# Patient Record
Sex: Female | Born: 1975 | Race: Black or African American | Hispanic: No | Marital: Single | Smoking: Never smoker
Health system: Southern US, Community
[De-identification: ages and names within clinical notes are randomized; demographics above are authoritative.]

## PROBLEM LIST (undated history)

## (undated) DIAGNOSIS — Z8249 Family history of ischemic heart disease and other diseases of the circulatory system: Secondary | ICD-10-CM

## (undated) DIAGNOSIS — E079 Disorder of thyroid, unspecified: Secondary | ICD-10-CM

## (undated) DIAGNOSIS — G43909 Migraine, unspecified, not intractable, without status migrainosus: Secondary | ICD-10-CM

## (undated) DIAGNOSIS — Z8619 Personal history of other infectious and parasitic diseases: Secondary | ICD-10-CM

## (undated) DIAGNOSIS — T148XXA Other injury of unspecified body region, initial encounter: Secondary | ICD-10-CM

## (undated) HISTORY — DX: Personal history of other infectious and parasitic diseases: Z86.19

## (undated) HISTORY — DX: Other injury of unspecified body region, initial encounter: T14.8XXA

## (undated) HISTORY — DX: Family history of ischemic heart disease and other diseases of the circulatory system: Z82.49

## (undated) HISTORY — PX: WISDOM TOOTH EXTRACTION: SHX21

## (undated) HISTORY — DX: Migraine, unspecified, not intractable, without status migrainosus: G43.909

## (undated) HISTORY — PX: OTHER SURGICAL HISTORY: SHX169

---

## 1999-05-17 ENCOUNTER — Encounter: Admission: RE | Admit: 1999-05-17 | Discharge: 1999-05-17 | Payer: Self-pay | Admitting: Obstetrics

## 2008-09-16 ENCOUNTER — Emergency Department (HOSPITAL_COMMUNITY): Admission: EM | Admit: 2008-09-16 | Discharge: 2008-09-16 | Payer: Self-pay | Admitting: Emergency Medicine

## 2008-11-17 ENCOUNTER — Encounter: Admission: RE | Admit: 2008-11-17 | Discharge: 2008-11-17 | Payer: Self-pay | Admitting: Orthopaedic Surgery

## 2008-11-23 ENCOUNTER — Encounter: Admission: RE | Admit: 2008-11-23 | Discharge: 2009-01-19 | Payer: Self-pay | Admitting: Orthopaedic Surgery

## 2009-01-06 ENCOUNTER — Encounter: Admission: RE | Admit: 2009-01-06 | Discharge: 2009-01-06 | Payer: Self-pay | Admitting: Orthopaedic Surgery

## 2010-10-25 DIAGNOSIS — G43909 Migraine, unspecified, not intractable, without status migrainosus: Secondary | ICD-10-CM | POA: Insufficient documentation

## 2010-12-18 DIAGNOSIS — G4700 Insomnia, unspecified: Secondary | ICD-10-CM | POA: Insufficient documentation

## 2012-03-12 ENCOUNTER — Encounter: Payer: Self-pay | Admitting: Obstetrics and Gynecology

## 2012-03-12 ENCOUNTER — Ambulatory Visit (INDEPENDENT_AMBULATORY_CARE_PROVIDER_SITE_OTHER): Payer: BC Managed Care – PPO | Admitting: Obstetrics and Gynecology

## 2012-03-12 VITALS — BP 120/70 | Ht 64.5 in | Wt 164.0 lb

## 2012-03-12 DIAGNOSIS — Z833 Family history of diabetes mellitus: Secondary | ICD-10-CM

## 2012-03-12 DIAGNOSIS — Z83438 Family history of other disorder of lipoprotein metabolism and other lipidemia: Secondary | ICD-10-CM

## 2012-03-12 DIAGNOSIS — Z2089 Contact with and (suspected) exposure to other communicable diseases: Secondary | ICD-10-CM

## 2012-03-12 DIAGNOSIS — Z8349 Family history of other endocrine, nutritional and metabolic diseases: Secondary | ICD-10-CM

## 2012-03-12 DIAGNOSIS — Z202 Contact with and (suspected) exposure to infections with a predominantly sexual mode of transmission: Secondary | ICD-10-CM

## 2012-03-12 DIAGNOSIS — Z01419 Encounter for gynecological examination (general) (routine) without abnormal findings: Secondary | ICD-10-CM

## 2012-03-12 LAB — COMPREHENSIVE METABOLIC PANEL
ALT: 14 U/L (ref 0–35)
Albumin: 4.4 g/dL (ref 3.5–5.2)
CO2: 29 mEq/L (ref 19–32)
Calcium: 9.7 mg/dL (ref 8.4–10.5)
Chloride: 104 mEq/L (ref 96–112)
Potassium: 4.6 mEq/L (ref 3.5–5.3)
Sodium: 139 mEq/L (ref 135–145)
Total Protein: 7.5 g/dL (ref 6.0–8.3)

## 2012-03-12 LAB — LIPID PANEL
Cholesterol: 241 mg/dL — ABNORMAL HIGH (ref 0–200)
VLDL: 10 mg/dL (ref 0–40)

## 2012-03-12 MED ORDER — HYDROCORTISONE ACE-PRAMOXINE 1-1 % RE FOAM: RECTAL | Status: DC

## 2012-03-12 MED ORDER — NORETHINDRONE-ETH ESTRADIOL 1-35 MG-MCG PO TABS: 1.0000 | ORAL_TABLET | Freq: Every day | ORAL | Status: DC

## 2012-03-12 NOTE — Addendum Note (Signed)
Addended by: Lerry Liner D on: 03/12/2012 02:31 PM   Modules accepted: Orders

## 2012-03-12 NOTE — Patient Instructions (Signed)
OTC Colace 100mg  one or two daily to keep BMs soft.    Rectal Bleeding Rectal bleeding is when blood passes out of the anus. It is usually a sign that something is wrong. It may not be serious, but it should always be evaluated. Rectal bleeding may present as bright red blood or extremely dark stools. The color may range from dark red or maroon to black (like tar). It is important that the cause of rectal bleeding be identified so treatment can be started and the problem corrected. CAUSES   Hemorrhoids. These are enlarged (dilated) blood vessels or veins in the anal or rectal area.  Fistulas. Theseare abnormal, burrowing channels that usually run from inside the rectum to the skin around the anus. They can bleed.  Anal fissures. This is a tear in the tissue of the anus. Bleeding occurs with bowel movements.  Diverticulosis. This is a condition in which pockets or sacs project from the bowel wall. Occasionally, the sacs can bleed.  Diverticulitis. Thisis an infection involving diverticulosis of the colon.  Proctitis and colitis. These are conditions in which the rectum, colon, or both, can become inflamed and pitted (ulcerated).  Polyps and cancer. Polyps are non-cancerous (benign) growths in the colon that may bleed. Certain types of polyps turn into cancer.  Protrusion of the rectum. Part of the rectum can project from the anus and bleed.  Certain medicines.  Intestinal infections.  Blood vessel abnormalities. HOME CARE INSTRUCTIONS  Eat a high-fiber diet to keep your stool soft.  Limit activity.  Drink enough fluids to keep your urine clear or pale yellow.  Warm baths may be useful to soothe rectal pain.  Follow up with your caregiver as directed. SEEK IMMEDIATE MEDICAL CARE IF:  You develop increased bleeding.  You have black or dark red stools.  You vomit blood or material that looks like coffee grounds.  You have abdominal pain or tenderness.  You have a  fever.  You feel weak, nauseous, or you faint.  You have severe rectal pain or you are unable to have a bowel movement. MAKE SURE YOU:  Understand these instructions.  Will watch your condition.  Will get help right away if you are not doing well or get worse. Document Released: 09/28/2001 Document Revised: 07/01/2011 Document Reviewed: 09/23/2010 [facility] Patient Information 2013 [place], [place]. Constipation, Adult Constipation is when a person has fewer than 3 bowel movements a week; has difficulty having a bowel movement; or has stools that are dry, hard, or larger than normal. As people grow older, constipation is more common. If you try to fix constipation with medicines that make you have a bowel movement (laxatives), the problem may get worse. Long-term laxative use may cause the muscles of the colon to become weak. A low-fiber diet, not taking in enough fluids, and taking certain medicines may make constipation worse. CAUSES   Certain medicines, such as antidepressants, pain medicine, iron supplements, antacids, and water pills.   Certain diseases, such as diabetes, irritable bowel syndrome (IBS), thyroid disease, or depression.   Not drinking enough water.   Not eating enough fiber-rich foods.   Stress or travel.  Lack of physical activity or exercise.  Not going to the restroom when there is the urge to have a bowel movement.  Ignoring the urge to have a bowel movement.  Using laxatives too much. SYMPTOMS   Having fewer than 3 bowel movements a week.   Straining to have a bowel movement.  Having hard, dry, or larger than normal stools.   Feeling full or bloated.   Pain in the lower abdomen.  Not feeling relief after having a bowel movement. DIAGNOSIS  Your caregiver will take a medical history and perform a physical exam. Further testing may be done for severe constipation. Some tests may include:   A barium enema X-ray to examine your rectum,  colon, and sometimes, your small intestine.  A sigmoidoscopy to examine your lower colon.  A colonoscopy to examine your entire colon. TREATMENT  Treatment will depend on the severity of your constipation and what is causing it. Some dietary treatments include drinking more fluids and eating more fiber-rich foods. Lifestyle treatments may include regular exercise. If these diet and lifestyle recommendations do not help, your caregiver may recommend taking over-the-counter laxative medicines to help you have bowel movements. Prescription medicines may be prescribed if over-the-counter medicines do not work.  HOME CARE INSTRUCTIONS   Increase dietary fiber in your diet, such as fruits, vegetables, whole grains, and beans. Limit high-fat and processed sugars in your diet, such as [place] fries, hamburgers, cookies, candies, and soda.   A fiber supplement may be added to your diet if you cannot get enough fiber from foods.   Drink enough fluids to keep your urine clear or pale yellow.   Exercise regularly or as directed by your caregiver.   Go to the restroom when you have the urge to go. Do not hold it.  Only take medicines as directed by your caregiver. Do not take other medicines for constipation without talking to your caregiver first. SEEK IMMEDIATE MEDICAL CARE IF:   You have bright red blood in your stool.   Your constipation lasts for more than 4 days or gets worse.   You have abdominal or rectal pain.   You have thin, pencil-like stools.  You have unexplained weight loss. MAKE SURE YOU:   Understand these instructions.  Will watch your condition.  Will get help right away if you are not doing well or get worse. Document Released: 01/05/2004 Document Revised: 07/01/2011 Document Reviewed: 03/12/2011 [facility] Patient Information 2013 [place], [place].

## 2012-03-12 NOTE — Progress Notes (Signed)
Subjective:    Brandy Cole is a 36 y.o. female, G1P0010, who presents for an annual exam.   Pt concerned about lighter period lasting 2-3days x 59yrs. Pt using condoms for [facility]. May consider BCPs.    Last Pap: 01/08/2011 WNL: Yes HPV-not done HPV-not detected in 2011 Regular Periods:yes Contraception: condoms  Monthly Breast exam:no Tetanus<86yrs:no Nl.Bladder Function:yes Daily BMs:no c/o rectal bleeding x 2wks. Occ constipation. No family h/o colon cancer Healthy Diet:yes Calcium:no Mammogram:no Date of Mammogram:  Exercise:no Have often Exercise:  Seatbelt: yes Abuse at home: no Stressful work:no Sigmoid-colonoscopy: n/a Bone Density: No PCP: [facility]. Dr.Dewey Change in PMH: no change  Change in FMH:no change BMI=27     History   Social History  . Marital Status: Married    Spouse Name: N/A    Number of Children: N/A  . Years of Education: N/A   Social History Main Topics  . Smoking status: Never Smoker   . Smokeless tobacco: Never Used  . Alcohol Use: None  . Drug Use: None  . Sexually Active: Yes    Birth Control/ Protection: Condom   Other Topics Concern  . None   Social History Narrative  . None    Menstrual cycle:   LMP: No LMP recorded.           Cycle: regular q28 days but very light less than a panty liner.  No cramps.  No IM bleeding  The following portions of the patient's history were reviewed and updated as appropriate: allergies, current medications, past family history, past medical history, past social history, past surgical history and problem list.  Review of Systems Pertinent items are noted in HPI. Breast:Negative for breast lump,nipple discharge or nipple retraction Gastrointestinal: Negative for abdominal pain.  Has had constipation and had initiation of rectal bleeding at that time.  Felt as if something was tearing.  Now occasionally notices blood on tissue paper. Urinary:negative   Objective:    There were no vitals  taken for this visit.    Weight:  Wt Readings from Last 1 Encounters:  No data found for Wt          BMI: There is no height or weight on file to calculate BMI.  General Appearance: Alert, appropriate appearance for age. No acute distress HEENT: Grossly normal Neck / Thyroid: Supple, no masses, nodes or enlargement Lungs: clear to auscultation bilaterally Back: No CVA tenderness Breast Exam: No masses or nodes.No dimpling, nipple retraction or discharge. Cardiovascular: Regular rate and rhythm. S1, S2, no murmur Gastrointestinal: Soft, non-tender, no masses or organomegaly Pelvic Exam: External genitalia: normal general appearance Vaginal: normal rugae Cervix: normal appearance Adnexa: no masses Uterus: normal single, nontender Rectovaginal: declined by the patient Lymphatic Exam: Non-palpable nodes in neck, clavicular, axillary, or inguinal regions Skin: no rash or abnormalities Neurologic: Normal gait and speech, no tremor  Psychiatric: Alert and oriented, appropriate affect.   Wet Prep:not applicable Urinalysis:not applicable UPT: Not done   Assessment:    Probable rectal fissure  Family hx diabetes and hypercholesterolemia   Plan:  Fasting cmp and lipid panel  pap smear due 2015 return annually or prn Proctofoam HC for 2 wks.  If any rectal bleeding recurs thereafter pt to call for GI referral STD screening: GC/CHL with consent Contraception:oral contraceptives (estrogen/progesterone) pt considering.  Script given   Dierdre Forth MD

## 2012-03-12 NOTE — Addendum Note (Signed)
Addended by: Lerry Liner D on: 03/12/2012 02:36 PM   Modules accepted: Orders

## 2012-03-13 LAB — GC/CHLAMYDIA PROBE AMP: GC Probe RNA: NEGATIVE

## 2012-04-23 DIAGNOSIS — Z8249 Family history of ischemic heart disease and other diseases of the circulatory system: Secondary | ICD-10-CM | POA: Insufficient documentation

## 2013-02-19 ENCOUNTER — Ambulatory Visit (INDEPENDENT_AMBULATORY_CARE_PROVIDER_SITE_OTHER): Payer: BC Managed Care – PPO | Admitting: Podiatrist

## 2013-02-19 ENCOUNTER — Encounter: Payer: Self-pay | Admitting: Podiatrist

## 2013-02-19 VITALS — BP 113/69 | HR 77 | Resp 16 | Ht 64.0 in | Wt 162.0 lb

## 2013-02-19 DIAGNOSIS — M722 Plantar fascial fibromatosis: Secondary | ICD-10-CM

## 2013-02-19 MED ORDER — NAPROXEN 500 MG PO TABS: 500.0000 mg | ORAL_TABLET | Freq: Two times a day (BID) | ORAL | Status: DC

## 2013-02-19 MED ORDER — MELOXICAM 15 MG PO TABS: 15.0000 mg | ORAL_TABLET | Freq: Every day | ORAL | Status: DC

## 2013-02-19 NOTE — Patient Instructions (Addendum)
Plantar Fasciitis (Heel Spur Syndrome) with Rehab The plantar fascia is a fibrous, ligament-like, soft-tissue structure that spans the bottom of the foot. Plantar fasciitis is a condition that causes pain in the foot due to inflammation of the tissue. SYMPTOMS   Pain and tenderness on the underneath side of the foot.  Pain that worsens with standing or walking. CAUSES  Plantar fasciitis is caused by irritation and injury to the plantar fascia on the underneath side of the foot. Common mechanisms of injury include:  Direct trauma to bottom of the foot.  Damage to a small nerve that runs under the foot where the main fascia attaches to the heel bone.  Stress placed on the plantar fascia due to bone spurs. RISK INCREASES WITH:   Activities that place stress on the plantar fascia (running, jumping, pivoting, or cutting).  Poor strength and flexibility.  Improperly fitted shoes.  Tight calf muscles.  Flat feet.  Failure to warm-up properly before activity.  Obesity. PREVENTION  Warm up and stretch properly before activity.  Allow for adequate recovery between workouts.  Maintain physical fitness:  Strength, flexibility, and endurance.  Cardiovascular fitness.  Maintain a health body weight.  Avoid stress on the plantar fascia.  Wear properly fitted shoes, including arch supports for individuals who have flat feet. PROGNOSIS  If treated properly, then the symptoms of plantar fasciitis usually resolve without surgery. However, occasionally surgery is necessary. RELATED COMPLICATIONS   Recurrent symptoms that may result in a chronic condition.  Problems of the lower back that are caused by compensating for the injury, such as limping.  Pain or weakness of the foot during push-off following surgery.  Chronic inflammation, scarring, and partial or complete fascia tear, occurring more often from repeated injections. TREATMENT  Treatment initially involves the use of  ice and medication to help reduce pain and inflammation. The use of strengthening and stretching exercises may help reduce pain with activity, especially stretches of the Achilles tendon. These exercises may be performed at home or with a therapist. Your caregiver may recommend that you use heel cups of arch supports to help reduce stress on the plantar fascia. Occasionally, corticosteroid injections are given to reduce inflammation. If symptoms persist for greater than 6 months despite non-surgical (conservative), then surgery may be recommended.  MEDICATION   If pain medication is necessary, then nonsteroidal anti-inflammatory medications, such as aspirin and ibuprofen, or other minor pain relievers, such as acetaminophen, are often recommended.  Do not take pain medication within 7 days before surgery.  Prescription pain relievers may be given if deemed necessary by your caregiver. Use only as directed and only as much as you need.  Corticosteroid injections may be given by your caregiver. These injections should be reserved for the most serious cases, because they may only be given a certain number of times. HEAT AND COLD  Cold treatment (icing) relieves pain and reduces inflammation. Cold treatment should be applied for 10 to 15 minutes every 2 to 3 hours for inflammation and pain and immediately after any activity that aggravates your symptoms. Use ice packs or massage the area with a piece of ice (ice massage).  Heat treatment may be used prior to performing the stretching and strengthening activities prescribed by your caregiver, physical therapist, or athletic trainer. Use a heat pack or soak the injury in warm water. SEEK IMMEDIATE MEDICAL CARE IF:  Treatment seems to offer no benefit, or the condition worsens.  Any medications produce adverse side effects. EXERCISES RANGE   OF MOTION (ROM) AND STRETCHING EXERCISES - Plantar Fasciitis (Heel Spur Syndrome) These exercises may help you  when beginning to rehabilitate your injury. Your symptoms may resolve with or without further involvement from your physician, physical therapist or athletic trainer. While completing these exercises, remember:   Restoring tissue flexibility helps normal motion to return to the joints. This allows healthier, less painful movement and activity.  An effective stretch should be held for at least 30 seconds.  A stretch should never be painful. You should only feel a gentle lengthening or release in the stretched tissue. RANGE OF MOTION - Toe Extension, Flexion  Sit with your right / left leg crossed over your opposite knee.  Grasp your toes and gently pull them back toward the top of your foot. You should feel a stretch on the bottom of your toes and/or foot.  Hold this stretch for __________ seconds.  Now, gently pull your toes toward the bottom of your foot. You should feel a stretch on the top of your toes and or foot.  Hold this stretch for __________ seconds. Repeat __________ times. Complete this stretch __________ times per day.  RANGE OF MOTION - Ankle Dorsiflexion, Active Assisted  Remove shoes and sit on a chair that is preferably not on a carpeted surface.  Place right / left foot under knee. Extend your opposite leg for support.  Keeping your heel down, slide your right / left foot back toward the chair until you feel a stretch at your ankle or calf. If you do not feel a stretch, slide your bottom forward to the edge of the chair, while still keeping your heel down.  Hold this stretch for __________ seconds. Repeat __________ times. Complete this stretch __________ times per day.  STRETCH  Gastroc, Standing  Place hands on wall.  Extend right / left leg, keeping the front knee somewhat bent.  Slightly point your toes inward on your back foot.  Keeping your right / left heel on the floor and your knee straight, shift your weight toward the wall, not allowing your back to  arch.  You should feel a gentle stretch in the right / left calf. Hold this position for __________ seconds. Repeat __________ times. Complete this stretch __________ times per day. STRETCH  Soleus, Standing  Place hands on wall.  Extend right / left leg, keeping the other knee somewhat bent.  Slightly point your toes inward on your back foot.  Keep your right / left heel on the floor, bend your back knee, and slightly shift your weight over the back leg so that you feel a gentle stretch deep in your back calf.  Hold this position for __________ seconds. Repeat __________ times. Complete this stretch __________ times per day. STRETCH  Gastrocsoleus, Standing  Note: This exercise can place a lot of stress on your foot and ankle. Please complete this exercise only if specifically instructed by your caregiver.   Place the ball of your right / left foot on a step, keeping your other foot firmly on the same step.  Hold on to the wall or a rail for balance.  Slowly lift your other foot, allowing your body weight to press your heel down over the edge of the step.  You should feel a stretch in your right / left calf.  Hold this position for __________ seconds.  Repeat this exercise with a slight bend in your right / left knee. Repeat __________ times. Complete this stretch __________ times per day.    STRENGTHENING EXERCISES - Plantar Fasciitis (Heel Spur Syndrome)  These exercises may help you when beginning to rehabilitate your injury. They may resolve your symptoms with or without further involvement from your physician, physical therapist or athletic trainer. While completing these exercises, remember:   Muscles can gain both the endurance and the strength needed for everyday activities through controlled exercises.  Complete these exercises as instructed by your physician, physical therapist or athletic trainer. Progress the resistance and repetitions only as guided. STRENGTH - Towel  Curls  Sit in a chair positioned on a non-carpeted surface.  Place your foot on a towel, keeping your heel on the floor.  Pull the towel toward your heel by only curling your toes. Keep your heel on the floor.  If instructed by your physician, physical therapist or athletic trainer, add ____________________ at the end of the towel. Repeat __________ times. Complete this exercise __________ times per day. STRENGTH - Ankle Inversion  Secure one end of a rubber exercise band/tubing to a fixed object (table, pole). Loop the other end around your foot just before your toes.  Place your fists between your knees. This will focus your strengthening at your ankle.  Slowly, pull your big toe up and in, making sure the band/tubing is positioned to resist the entire motion.  Hold this position for __________ seconds.  Have your muscles resist the band/tubing as it slowly pulls your foot back to the starting position. Repeat __________ times. Complete this exercises __________ times per day.  Document Released: 04/08/2005 Document Revised: 07/01/2011 Document Reviewed: 07/21/2008 [facility] Patient Information 2014 [place], [place].  Endoscopic Plantar Fasciotomy On the underside of the foot and heel is a tight band of tissue called the plantar fascia. Sometimes the plantar fascia become inflamed (the body's way of reacting to injury, overuse or infection) which produces pain. The condition is known as plantar fasciitis.  One way to treat plantar fasciitis is through an endoscopic plantar fasciotomy. This is surgery to reduce the tension on the plantar fascia. However, it is a minimally invasive surgery because there will be no large incision. Instead, the surgeon inserts a thin, flexible tube through a small (1/16th of an inch (1.59 mm)) cut in your skin. The surgeon can examine and release the fascia through this tube. Recovery from an endoscopic fasciotomy is usually less painful and faster than from  open surgery. LET YOUR CAREGIVER KNOW ABOUT:  Any allergies.  All medications you are taking, including:  Herbs, eyedrops, over-the-counter medications and creams.  Blood thinners (anticoagulants) or other drugs that could affect blood clotting.  Use of steroids (by mouth or as creams).  Previous problems with anesthetics, including local anesthetics.  Possibility of pregnancy, if this applies.  Any history of blood clots.  Any history of bleeding or other blood problems.  Previous surgery.  Smoking history.  Other health problems.  Family history of anesthetic problems RISKS AND COMPLICATIONS  Short-term possibilities include:  Excessive bleeding.  Pain.  Loss of feeling (numbness) at the site of the incision.  Hematoma, a pooling of blood in the wound.  Infection.  Slow resolution of the symptoms. Longer-term possibilities include:  Scarring.  A return of the condition that led to fasciotomy.  Damage to nerves in the area.  Weakness in your foot.  Need for additional surgery. BEFORE THE PROCEDURE  Ask whether you need to get shoes that will support your heel and arch while you recover.  7 to 10 days before the surgery, stop using  aspirin and non-steroidal anti-inflammatory drugs (NSAIDs) for pain relief. This includes prescription drugs and over-the-counter drugs such as ibuprofen and naproxen.  If you take blood-thinners, ask your healthcare provider when you should stop taking them.  Do not eat or drink for about 8 hours before your surgery.  You might be asked to shower or wash with a special antibacterial soap before the procedure.  Arrive 1-2 hours before the surgery, or whenever your surgeon recommends. This will give you time to check in and fill out any needed paperwork.  If your surgery is an outpatient procedure, you will be able to go home the same day. Make arrangements in advance for someone to drive you home. PROCEDURE  You may be  given general anesthesia (you will be asleep), regional anesthesia (your leg will be numbed) or local anesthesia (just the area around the fascia will be numbed). With regional and local anesthesia you will be given medication to make you groggy but awake during the procedure.  Your foot will be cleaned and sterilized.  The surgeon will make a cut (incision) on the side of your heel. Then a thin tube that contains a tiny camera will be inserted into the space. The camera makes it possible for the surgeon to see what is happening inside your foot.  The surgeon will work through this tube to release the fascia.  The tube will be removed, and dressing will be applied to the incisions. AFTER THE PROCEDURE After the procedure, you will be taken to another room to recover. People usually go home the same day. Before leaving, make sure you have detailed instructions on how to care for the incision. Also, you may be given crutches and shown how to use them. Ask your surgeon whether physical therapy will be needed.  HOME CARE INSTRUCTIONS   Take any prescription medication for pain and/or nausea that your surgeon prescribes. Follow the directions carefully and take all of the medication.  Ask your surgeon whether you can take over-the-counter medicines for pain, discomfort or fever. Do not take aspirin unless your healthcare provider says to. Aspirin can increase the chances of bleeding.  You may need to put ice on your foot for 10 to 15 minutes each day for several days.  While you are resting, keep your foot elevated above the level of your heart.  Do not get the incisions wet for the first few days after surgery (or until the surgeon tells you it is OK).  Avoid standing or walking for long periods. Your healthcare provider will tell you when you are clear to resume normal activity. If your job requires a lot of standing or walking, ask to be assigned to a less active position for about 8  weeks.  When you are up and about, wear shoes with a supportive heel and arch support. Soft running shoes may be recommended for the first two weeks of recovery. SEEK MEDICAL CARE IF:   The wound becomes red or swollen.  The wound leaks fluid or blood.  Your pain increases.  You become nauseous or vomit for more than two days after the surgery.  You have pain or difficulty moving your foot.  You develop a fever of more than 100.5 F (38.1 C). SEEK IMMEDIATE MEDICAL CARE IF:   Your leg or foot starts to swell.  You develop a fever of 102.0 F (38.9 C) or higher. Document Released: 02/03/2009 Document Revised: 07/01/2011 Document Reviewed: 02/03/2009 [facility] Patient Information 2014 [place], [place].

## 2013-02-19 NOTE — Progress Notes (Signed)
Subjective: Patient presents today for followup of plantar fasciitis left foot we have tried multiple plantar fascial injections (x 2) steroid Dosepak anti-inflammatories which have improved her symptoms minimally . Also recently she was placed in a short air fracture walker which did offer some improvement. She already has inserts for her shoes and these are minimally beneficial as well. All modalities tried have offered some improvement. Objective: Neurovascular status intact negative Tinel sign elicited continued plantar fasciitis symptomatology elicited left. Assessment is plantar fasciitis left chronic Plan: Discussed endoscopic plantar fascial release considering all other therapies have failed. Patient would like to try her night splint and some more stretching exercises as well as naproxen to see if this will be of benefit. I did agree and I will see her back in one month for reconsideration of further treatment.

## 2013-07-15 DIAGNOSIS — R519 Headache, unspecified: Secondary | ICD-10-CM | POA: Insufficient documentation

## 2013-07-15 DIAGNOSIS — R51 Headache: Secondary | ICD-10-CM

## 2013-07-20 ENCOUNTER — Telehealth: Payer: Self-pay | Admitting: *Deleted

## 2013-07-20 NOTE — Telephone Encounter (Signed)
I had a handicap sticker that expires the end of this month.  I want to get it renewed.  I'm being treated for Plantar Fasciitis.. It's not gone away.  I don't want to do surgery at this point.  I told her Dr. Irving ShowsEgerton is out of the office this week.  I will go ahead and okay it for 3 months.  She stated she would come by tomorrow to pick it up.

## 2013-10-19 DIAGNOSIS — Z3041 Encounter for surveillance of contraceptive pills: Secondary | ICD-10-CM | POA: Insufficient documentation

## 2013-10-19 HISTORY — DX: Encounter for surveillance of contraceptive pills: Z30.41

## 2013-10-20 ENCOUNTER — Telehealth: Payer: Self-pay | Admitting: *Deleted

## 2013-10-20 NOTE — Telephone Encounter (Signed)
Can I get an extinction of my handicap plaque?  If I need to schedule an appointment, I will do so.  My foot has gotten better.  It aches if I walk more than 5 minutes.  Please give me a call.

## 2013-10-20 NOTE — Telephone Encounter (Signed)
I called and informed her that Dr. Dimas AguasEgerton okayed the handicap parking permit for another 6 months.  I told her she will have to come by the office to pick up the requisition [facility]  She stated she would come by tomorrow to pick it up.

## 2013-10-20 NOTE — Telephone Encounter (Signed)
Sure that's fine-- do 6 more months

## 2014-02-21 ENCOUNTER — Encounter: Payer: Self-pay | Admitting: Podiatrist

## 2014-05-06 ENCOUNTER — Other Ambulatory Visit: Payer: Self-pay | Admitting: Obstetrics and Gynecology

## 2014-05-06 DIAGNOSIS — Z1231 Encounter for screening mammogram for malignant neoplasm of breast: Secondary | ICD-10-CM

## 2014-05-10 ENCOUNTER — Ambulatory Visit
Admission: RE | Admit: 2014-05-10 | Discharge: 2014-05-10 | Disposition: A | Discharge status: Home / Self Care | Payer: BC Managed Care – PPO | Source: Ambulatory Visit | Attending: Obstetrics and Gynecology | Admitting: Obstetrics and Gynecology

## 2014-05-10 ENCOUNTER — Ambulatory Visit: Payer: Self-pay

## 2014-05-10 DIAGNOSIS — Z1231 Encounter for screening mammogram for malignant neoplasm of breast: Secondary | ICD-10-CM

## 2014-08-16 ENCOUNTER — Other Ambulatory Visit: Payer: Self-pay | Admitting: Orthopedic Surgery

## 2014-08-16 DIAGNOSIS — M25562 Pain in left knee: Secondary | ICD-10-CM

## 2014-09-03 ENCOUNTER — Other Ambulatory Visit: Payer: BC Managed Care – PPO

## 2014-09-04 ENCOUNTER — Ambulatory Visit
Admission: RE | Admit: 2014-09-04 | Discharge: 2014-09-04 | Disposition: A | Discharge status: Home / Self Care | Payer: BC Managed Care – PPO | Source: Ambulatory Visit | Attending: Orthopedic Surgery | Admitting: Orthopedic Surgery

## 2014-09-04 DIAGNOSIS — M25562 Pain in left knee: Secondary | ICD-10-CM

## 2014-10-28 DIAGNOSIS — E042 Nontoxic multinodular goiter: Secondary | ICD-10-CM | POA: Insufficient documentation

## 2015-03-24 DIAGNOSIS — E0500 Thyrotoxicosis with diffuse goiter without thyrotoxic crisis or storm: Secondary | ICD-10-CM | POA: Insufficient documentation

## 2016-02-02 DIAGNOSIS — N760 Acute vaginitis: Secondary | ICD-10-CM | POA: Insufficient documentation

## 2016-07-17 ENCOUNTER — Ambulatory Visit (INDEPENDENT_AMBULATORY_CARE_PROVIDER_SITE_OTHER): Payer: BC Managed Care – PPO | Admitting: Student

## 2016-07-17 ENCOUNTER — Encounter (INDEPENDENT_AMBULATORY_CARE_PROVIDER_SITE_OTHER): Payer: Self-pay

## 2016-07-17 ENCOUNTER — Encounter: Payer: Self-pay | Admitting: Student

## 2016-07-17 DIAGNOSIS — R269 Unspecified abnormalities of gait and mobility: Secondary | ICD-10-CM

## 2016-07-17 NOTE — Progress Notes (Signed)
  Brandy Cole - 41 y.o. female MRN [id number]  Date of birth: 04/07/1976  SUBJECTIVE:  Including CC & ROS.  CC: Overpronation of bilateral feet  Brandy Cole presents for evaluation for possible inserts in her shoes. [facility] He is in physical therapy and it was noticed that she does overpronate when she stands up. She has no pain in her feet at this time.   ROS: No unexpected weight loss, fever, chills, swelling, instability, muscle pain, numbness/tingling, redness, otherwise see HPI   PMHx - Updated and reviewed.  Contributory factors include: Bilateral patellar subluxation, Graves' disease PSHx - Updated and reviewed.  Contributory factors include:  Negative FHx - Updated and reviewed.  Contributory factors include:  Negative Social Hx - Updated and reviewed. Contributory factors include: Negative Medications - reviewed   DATA REVIEWED: None  PHYSICAL EXAM:  VS: BP:137/72  HR: bpm  TEMP: ( )  RESP:   HT:5' 3.5" (161.3 cm)   WT:180 lb (81.6 kg)  BMI:31.5 PHYSICAL EXAM: Gen: NAD, alert, cooperative with exam, well-appearing HEENT: clear conjunctiva,  CV:  no edema, capillary refill brisk, normal rate Resp: non-labored Skin: no rashes, normal turgor  Neuro: no gross deficits.  Psych:  alert and oriented  Ankle & Foot: No visible swelling, ecchymosis, erythema, ulcers, calluses, blister Arch: Normal w/o pes cavus or planus when sitting, upon standing she has overpronation bilaterally  Posterior tibialis without swelling or pain upon palpation Full in plantarflexion, dorsiflexion, inversion, and eversion of the foot; flexion and extension of the toes Strength: 5/5 in all directions. Sensation: intact Vascular: intact w/ dorsalis pedis & posterior tibialis pulses 2+  Gait  Overall balanced gait with appropriate base width and stride length  Foot: supination; No in-toeing or out-toeing; No foot slap or high steppage gait  Knee:  extension and flexion adequate w/o genu varus or valgus  Hip: No circumduction or contralateral drop  Trunk: Neutral w/o lean      ASSESSMENT & PLAN:   Abnormality of gait Green inserts size 7.5- 8.5 were given with scaphoid pads. Patient appreciated some comfort and having arch support. She'll follow-up in 1 month for possible custom orthotics. Gave some posterior tibialis tendinitis exercises to do on a daily basis.

## 2016-07-17 NOTE — Assessment & Plan Note (Signed)
Green inserts size 7.5- 8.5 were given with scaphoid pads. Patient appreciated some comfort and having arch support. She'll follow-up in 1 month for possible custom orthotics. Gave some posterior tibialis tendinitis exercises to do on a daily basis.

## 2016-08-21 ENCOUNTER — Ambulatory Visit (INDEPENDENT_AMBULATORY_CARE_PROVIDER_SITE_OTHER): Payer: BC Managed Care – PPO | Admitting: Student

## 2016-08-21 ENCOUNTER — Encounter: Payer: Self-pay | Admitting: Student

## 2016-08-21 VITALS — BP 130/83 | Ht 64.0 in | Wt 180.0 lb

## 2016-08-21 DIAGNOSIS — R269 Unspecified abnormalities of gait and mobility: Secondary | ICD-10-CM

## 2016-08-21 NOTE — Assessment & Plan Note (Signed)
Custom orthotics made. Follow-up when necessary

## 2016-08-21 NOTE — Progress Notes (Signed)
Brandy Cole presents in follow-up for orthotic placement. She has been doing well with the green inserts.  Patient was fitted for a standard, cushioned, semi-rigid orthotic. The orthotic was heated and afterward the patient stood on the orthotic blank positioned on the orthotic stand. The patient was positioned in subtalar neutral position and 10 degrees of ankle dorsiflexion in a weight bearing stance. After completion of molding, a stable base was applied to the orthotic blank. The blank was ground to a stable position for weight bearing. Size: 7 Base: Blue EVA Additional Posting and Padding: None The patient ambulated these, and they were very comfortable.  I spent 40 minutes with this patient, greater than 50% was face-to-face time counseling regarding the below diagnosis.

## 2017-03-17 DIAGNOSIS — R7303 Prediabetes: Secondary | ICD-10-CM | POA: Insufficient documentation

## 2017-05-21 LAB — HM PAP SMEAR

## 2017-05-21 LAB — HM MAMMOGRAPHY

## 2017-06-22 ENCOUNTER — Encounter (HOSPITAL_COMMUNITY): Payer: Self-pay | Admitting: Emergency Medicine

## 2017-06-22 ENCOUNTER — Emergency Department (HOSPITAL_COMMUNITY): Payer: BC Managed Care – PPO

## 2017-06-22 ENCOUNTER — Other Ambulatory Visit: Payer: Self-pay

## 2017-06-22 DIAGNOSIS — E039 Hypothyroidism, unspecified: Secondary | ICD-10-CM | POA: Insufficient documentation

## 2017-06-22 DIAGNOSIS — Z79899 Other long term (current) drug therapy: Secondary | ICD-10-CM | POA: Diagnosis not present

## 2017-06-22 DIAGNOSIS — R0789 Other chest pain: Secondary | ICD-10-CM | POA: Insufficient documentation

## 2017-06-22 LAB — BASIC METABOLIC PANEL
Anion gap: 12 (ref 5–15)
BUN: 5 mg/dL — ABNORMAL LOW (ref 6–20)
CO2: 23 mmol/L (ref 22–32)
Calcium: 9 mg/dL (ref 8.9–10.3)
Chloride: 103 mmol/L (ref 101–111)
Creatinine, Ser: 0.83 mg/dL (ref 0.44–1.00)
GFR calc Af Amer: >60 mL/min (ref >60)
Glucose, Bld: 95 mg/dL (ref 65–99)
POTASSIUM: 4.1 mmol/L (ref 3.5–5.1)
SODIUM: 138 mmol/L (ref 135–145)

## 2017-06-22 LAB — CBC
HEMATOCRIT: 35.8 % — AB (ref 36.0–46.0)
Hemoglobin: 12 g/dL (ref 12.0–15.0)
MCH: 30.8 pg (ref 26.0–34.0)
MCHC: 33.5 g/dL (ref 30.0–36.0)
MCV: 91.8 fL (ref 78.0–100.0)
PLATELETS: 385 10*3/uL (ref 150–400)
RBC: 3.9 MIL/uL (ref 3.87–5.11)
RDW: 14.2 % (ref 11.5–15.5)
WBC: 6 10*3/uL (ref 4.0–10.5)

## 2017-06-22 LAB — I-STAT TROPONIN, ED: Troponin i, poc: 0 ng/mL (ref 0.00–0.08)

## 2017-06-22 LAB — I-STAT BETA HCG BLOOD, ED (MC, WL, AP ONLY)

## 2017-06-22 NOTE — ED Triage Notes (Signed)
Pt c/o 8/10 central cp with some SO that started this morning, no nausea or vomiting.

## 2017-06-22 NOTE — ED Notes (Signed)
Results reviewed.  No changes to acuity at this time 

## 2017-06-23 ENCOUNTER — Emergency Department (HOSPITAL_COMMUNITY)
Admission: EM | Admit: 2017-06-23 | Discharge: 2017-06-23 | Disposition: A | Discharge status: Home / Self Care | Payer: BC Managed Care – PPO | Attending: Emergency Medicine | Admitting: Emergency Medicine

## 2017-06-23 DIAGNOSIS — R0789 Other chest pain: Secondary | ICD-10-CM

## 2017-06-23 HISTORY — DX: Disorder of thyroid, unspecified: E07.9

## 2017-06-23 LAB — D-DIMER, QUANTITATIVE (NOT AT ARMC): D DIMER QUANT: 0.47 ug{FEU}/mL (ref 0.00–0.50)

## 2017-06-23 LAB — TSH: TSH: 0.964 u[IU]/mL (ref 0.350–4.500)

## 2017-06-23 LAB — I-STAT TROPONIN, ED: Troponin i, poc: 0 ng/mL (ref 0.00–0.08)

## 2017-06-23 MED ORDER — CYCLOBENZAPRINE HCL 10 MG PO TABS: 10.0000 mg | ORAL_TABLET | Freq: Two times a day (BID) | ORAL | 0 refills | Status: DC | PRN

## 2017-06-23 MED ORDER — IBUPROFEN 800 MG PO TABS: 800.0000 mg | ORAL_TABLET | Freq: Three times a day (TID) | ORAL | 0 refills | Status: DC

## 2017-06-23 NOTE — ED Provider Notes (Signed)
[facility] Provider Note   CSN: [id number] Arrival date & time: 06/22/17  2135     History   Chief Complaint Chief Complaint  Patient presents with  . Chest Pain    HPI Brandy Cole is a 42 y.o. female.  Patient with past medical history remarkable for Graves' disease presents to the emergency department with a chief complaint of chest pressure.  She states that she noticed the symptoms around 10 AM this morning.  They have been present all day.  She states that about 6 PM she noticed some pain in her right shoulder and in her neck.  She denies diaphoresis or shortness of breath.  She states that the pain is worsened when she takes a deep breath, and improves when she lies on her left side.  She reports having had a cold a couple of weeks ago.  She denies any changes in her symptoms when she lies down versus sitting up.  She denies any history of PE or DVT.  Denies any recent long travel, leg swelling, surgery, or immobilization.  She denies any history of ACS and herself.  She has not taken anything for her symptoms.   The history is provided by the patient. No language interpreter was used.    Past Medical History:  Diagnosis Date  . H/O chlamydia infection   . Thyroid disease     Patient Active Problem List   Diagnosis Date Noted  . Abnormality of gait 07/17/2016    Past Surgical History:  Procedure Laterality Date  . WISDOM TOOTH EXTRACTION      OB History    Gravida Para Term Preterm AB Living   1       1 0   SAB TAB Ectopic Multiple Live Births     1             Home Medications    Prior to Admission medications   Medication Sig Start Date End Date Taking? Authorizing Provider  ALPRAZolam Prudy Feeler(XANAX) 0.5 MG tablet  04/10/16   [provider]  aspirin-acetaminophen-caffeine (EXCEDRIN MIGRAINE) [phone] MG tablet Take by mouth.    [provider]  clobetasol (TEMOVATE) 0.05 % external solution [place] as  directed 03/09/16   [provider]  fish oil-omega-3 fatty acids 1000 MG capsule Take 1 g by mouth daily.    [provider]  hydrocortisone-pramoxine (PROCTOFOAM HC) rectal foam Pt to insert 1 applicator per rectum 4 times daily x 3days;then insert 1 applicator per rectum 2 times daily x 11 days. 03/12/12   Haygood, Maris BergerVanessa P, MD  Multiple Vitamins-Minerals (MULTIVITAL PO) Take 1 tablet by mouth daily.    [provider]  naproxen (NAPROSYN) 500 MG tablet Take 1 tablet (500 mg total) by mouth 2 (two) times daily with a meal. 02/19/13   Delories HeinzEgerton, Kathryn P, DPM  norethindrone-ethinyl estradiol 1/35 (ORTHO-NOVUM, NORTREL,CYCLAFEM) tablet Take 1 tablet by mouth daily. 03/12/12   Haygood, Maris BergerVanessa P, MD  Probiotic CAPS Take by mouth. 01/29/11   [provider]  Probiotic Product (SOLUBLE FIBER/PROBIOTICS PO) Take 1 capsule by mouth.    [provider]  propylthiouracil (PTU) 50 MG tablet  07/02/16   [provider]  propylthiouracil (PTU) 50 MG tablet Take by mouth. 07/20/15   [provider]  propylthiouracil (PTU) 50 MG tablet Take by mouth. 08/02/16   [provider]  rizatriptan (MAXALT-MLT) 10 MG disintegrating tablet  05/28/16   [provider]  terconazole (  TERAZOL 7) 0.4 % vaginal cream  05/20/16   [provider]    Family History Family History  Problem Relation Age of Onset  . Heart disease Father   . Hyperlipidemia Father   . Diabetes Father   . Hyperlipidemia Mother     Social History Social History   Tobacco Use  . Smoking status: Never Smoker  . Smokeless tobacco: Never Used  Substance Use Topics  . Alcohol use: No  . Drug use: No     Allergies   Methimazole and Voltaren  [diclofenac]   Review of Systems Review of Systems  All other systems reviewed and are negative.    Physical Exam Updated Vital Signs BP 137/78 (BP Location: Right Arm)   Pulse 82   Temp 98.8 F (37.1 C)  (Oral)   Resp 18   Ht 5\' 4"  (1.626 m)   Wt 81.6 kg (180 lb)   LMP 06/02/2017   SpO2 100%   BMI 30.90 kg/m   Physical Exam  Constitutional: She is oriented to person, place, and time. She appears well-developed and well-nourished.  HENT:  Head: Normocephalic and atraumatic.  Eyes: Conjunctivae and EOM are normal. Pupils are equal, round, and reactive to light.  Neck: Normal range of motion. Neck supple.  Cardiovascular: Normal rate and regular rhythm. Exam reveals no gallop and no friction rub.  No murmur heard. Discomfort with palpation of anterior chest wall  Pulmonary/Chest: Effort normal and breath sounds normal. No respiratory distress. She has no wheezes. She has no rales. She exhibits no tenderness.  Abdominal: Soft. Bowel sounds are normal. She exhibits no distension and no mass. There is no tenderness. There is no rebound and no guarding.  Musculoskeletal: Normal range of motion. She exhibits no edema or tenderness.       Right lower leg: She exhibits no tenderness.       Left lower leg: She exhibits no tenderness.  Neurological: She is alert and oriented to person, place, and time.  Skin: Skin is warm and dry.  Psychiatric: She has a normal mood and affect. Her behavior is normal. Judgment and thought content normal.  Nursing note and vitals reviewed.    ED Treatments / Results  Labs (all labs ordered are listed, but only abnormal results are displayed) Labs Reviewed  BASIC METABOLIC PANEL - Abnormal; Notable for the following components:      Result Value   BUN 5 (*)    All other components within normal limits  CBC - Abnormal; Notable for the following components:   HCT 35.8 (*)    All other components within normal limits  D-DIMER, QUANTITATIVE (NOT AT [facility])  I-STAT TROPONIN, ED  I-STAT BETA HCG BLOOD, ED ([facility], [facility], [facility] ONLY)  I-STAT TROPONIN, ED    EKG  EKG Interpretation None       Radiology Dg Chest 2 View  Result Date: 06/22/2017 CLINICAL DATA:   Central chest pain. EXAM: CHEST  2 VIEW COMPARISON:  None. FINDINGS: The cardiomediastinal contours are normal. The lungs are clear. Pulmonary vasculature is normal. No consolidation, pleural effusion, or pneumothorax. No acute osseous abnormalities are seen. IMPRESSION: Unremarkable radiographs of the chest. Electronically Signed   By: Rubye Oaks M.D.   On: 06/22/2017 22:15    Procedures Procedures (including critical care time)  Medications Ordered in ED Medications - No data to display   Initial Impression / Assessment and Plan / ED Course  I have reviewed the triage vital signs and the  nursing notes.  Pertinent labs & imaging results that were available during my care of the patient were reviewed by me and considered in my medical decision making (see chart for details).     Patient with chest pain that started this morning around 10 AM.  She states the pain is been constant throughout the day.  She states that around 6 PM she noticed some right shoulder pain and some pain radiating to her neck.  She denies any diaphoresis or shortness of breath.  She states that she has felt fatigued, and slightly lightheaded.  She states that she saw a cardiologist and was told that her heart has trouble relaxing, but had no other follow-up or workup performed or advised.  She does have a history of Graves' disease, and is compliant with her medication for this.  She denies any recent changes.  Initial troponin is 0.00, EKG shows nonspecific changes.  Electrolytes are normal.  No leukocytosis, patient is not anemic.  Chest x-ray is unremarkable.  Will check repeat troponin, which is negative would be reassuring that it is not ACS type chest pain.  Her heart score is 1.    Will check d-dimer given lightheadedness and pleuritic pain.  I have a low suspicion for PE, if d-dimer is negative, feel that no additional PE workup is needed.  I doubt pericarditis, there is no audible rub, no position  changes during my exam, however it does remain on the differential given recent illness.  Chest pain is pleuritic in nature, and reproducible with palpation of the anterior chest wall, which the patient states feels like the same pain that she is experiencing when palpated.  D-dimer is negative.  Repeat troponin is 0.00.  TSH is patient feels reassured.  Will treat for chest wall pain.  Final Clinical Impressions(s) / ED Diagnoses   Final diagnoses:  Chest wall pain    ED Discharge Orders        Ordered    ibuprofen (ADVIL,MOTRIN) 800 MG tablet  3 times daily     06/23/17 0316    cyclobenzaprine (FLEXERIL) 10 MG tablet  2 times daily PRN     06/23/17 0316       Roxy Horseman, PA-C 06/23/17 1610    Azalia Bilis, MD 06/23/17 [phone]

## 2017-06-23 NOTE — ED Notes (Signed)
Tech drawing labs 

## 2017-11-13 ENCOUNTER — Encounter: Payer: Self-pay | Admitting: Family Medicine

## 2017-11-13 ENCOUNTER — Other Ambulatory Visit: Payer: Self-pay

## 2017-11-13 ENCOUNTER — Ambulatory Visit: Payer: BC Managed Care – PPO | Admitting: Family Medicine

## 2017-11-13 VITALS — BP 120/82 | HR 101 | Temp 98.1°F | Ht 64.25 in | Wt 157.2 lb

## 2017-11-13 DIAGNOSIS — Z3041 Encounter for surveillance of contraceptive pills: Secondary | ICD-10-CM

## 2017-11-13 DIAGNOSIS — R7303 Prediabetes: Secondary | ICD-10-CM

## 2017-11-13 DIAGNOSIS — E0500 Thyrotoxicosis with diffuse goiter without thyrotoxic crisis or storm: Secondary | ICD-10-CM

## 2017-11-13 NOTE — Progress Notes (Signed)
Subjective  CC:  Chief Complaint  Patient presents with  . Establish Care    Transfer from [place], Last Physical was 11/2016, no complaints    HPI: Brandy Cole is a 42 y.o. female is a former [facility] patient and is here to reestablish care with me today.   I reviewed noted from most recent pcp and specialists offices.  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 No sxs of hyperthyroidism. Fears weight gain if treats graves with radioablation tx  Obesity: has lost 25-30 pounds with bariatrics. Doing well   HM is up to date Assessment  1. Graves disease   2. Graves' ophthalmopathy   3. Pre-diabetes   4. Oral contraceptive use      Plan   Chronic problems are controlled. Counseled regarding options of tx for graves and prevention of graves sequelae/ she will discuss further with endocrine.  Weight: improved. Continue with healthy balanced diet.   Next visit for cpe.   Follow up:  Return in about 4 months (around 03/16/2018) for complete physical.  No orders of the defined types were placed in this encounter.  No orders of the defined types were placed in this encounter.     We updated and reviewed the patient's past history in detail and it is documented below.  Patient Active Problem List   Diagnosis Date Noted  . 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 Pre-diabetes 03/17/2017  . Recurrent vaginitis 02/02/2016  . Graves disease 03/24/2015  . Multiple thyroid nodules 10/28/2014  . Oral contraceptive use 10/19/2013  . Chronic daily headache 07/15/2013  . FH: premature coronary heart disease 04/23/2012  . Insomnia 12/18/2010    Overview:  Sleep study done. No apnea   . Migraine 10/25/2010    Overview:  [facility] - [facility]; nl CT scan in past.    Health Maintenance  Topic Date Due  . HIV Screening  03/24/1989  . PAP SMEAR  03/25/1995    . INFLUENZA VACCINE  11/20/2017  . TETANUS/TDAP  10/20/2023   Immunization History  Administered Date(s) Administered  . Hepatitis A, Ped/Adol-2 Dose 04/29/2014  . Tdap 10/19/2013   Current Meds  Medication Sig  . cycloSPORINE (RESTASIS) 0.05 % ophthalmic emulsion Restasis 0.05 % eye drops in a dropperette  INT 1 GTT INTO OU BID  . Phentermine-Topiramate (QSYMIA) 7.5-46 MG CP24 Take by mouth.  . Probiotic CAPS Take by mouth.  . propylthiouracil (PTU) 50 MG tablet Take 50 mg by mouth 2 (two) times daily.     Allergies: Patient is allergic to methimazole; voltaren  [diclofenac sodium]; and voltaren  [diclofenac]. Past Medical History Patient  has a past medical history of FHx: premature coronary heart disease, H/O chlamydia infection, Migraine, and Thyroid disease. Past Surgical History Patient  has a past surgical history that includes Wisdom tooth extraction. Family History: Patient family history includes Diabetes in her father; Heart disease in her father; Hyperlipidemia in her father and mother. Social History:  Patient  reports that she has never smoked. She has never used smokeless tobacco. She reports that she does not drink alcohol or use drugs.  Review of Systems: Constitutional: negative for fever or malaise Ophthalmic: negative for photophobia, double vision or loss of vision Cardiovascular: negative for chest pain, dyspnea on exertion, or new LE swelling Respiratory: negative for SOB or persistent cough Gastrointestinal: negative for abdominal pain, change  in bowel habits or melena Genitourinary: negative for dysuria or gross hematuria Musculoskeletal: negative for new gait disturbance or muscular weakness Integumentary: negative for new or persistent rashes Neurological: negative for TIA or stroke symptoms Psychiatric: negative for SI or delusions Allergic/Immunologic: negative for hives  Patient Care Team    Relationship Specialty Notifications Start End   Willow OraAndy, Camille L, MD PCP - General Family Medicine  10/31/17   Marrianne MoodBeck, Tricia M, RD    11/13/17 11/13/17  Hal MoralesHaygood, Vanessa P, MD Consulting Physician Obstetrics and Gynecology  11/13/17   Berdine DanceSnow, Peter E, NP Nurse Practitioner General Surgery  11/13/17   Daiva NakayamaHairston, Kristen, MD Referring Physician Internal Medicine  11/13/17     Objective  Vitals: BP 120/82   Pulse (!) 101   Temp 98.1 F (36.7 C)   Ht 5' 4.25" (1.632 m)   Wt 157 lb 3.2 oz (71.3 kg)   SpO2 98%   BMI 26.77 kg/m  General:  Well developed, well nourished, no acute distress  Psych:  Alert and oriented,normal mood and affect HEENT:  Normocephalic, atraumatic, non-icteric sclera, PERRL, oropharynx is without mass or exudate, supple neck without adenopathy, mass + diffuse thyromegaly w/o nodules Cardiovascular:  RRR without gallop, rub or murmur, nondisplaced PMI Respiratory:  Good breath sounds bilaterally, CTAB with normal respiratory effort Gastrointestinal: normal bowel sounds, soft, non-tender, no noted masses. No HSM MSK: no deformities, contusions. Joints are without erythema or swelling Skin:  Warm, no rashes or suspicious lesions noted Neurologic:    Mental status is normal. Gross motor and sensory exams are normal. Normal gait  Commons side effects, risks, benefits, and alternatives for medications and treatment plan prescribed today were discussed, and the patient expressed understanding of the given instructions. Patient is instructed to call or message via MyChart if he/she has any questions or concerns regarding our treatment plan. No barriers to understanding were identified. We discussed Red Flag symptoms and signs in detail. Patient expressed understanding regarding what to do in case of urgent or emergency type symptoms.   Medication list was reconciled, printed and provided to the patient in AVS. Patient instructions and summary information was reviewed with the patient as documented in the AVS. This note was prepared  with assistance of Dragon voice recognition software. Occasional wrong-word or sound-a-like substitutions may have occurred due to the inherent limitations of voice recognition software

## 2017-11-13 NOTE — Patient Instructions (Signed)
Please return in November for your annual complete physical; please come fasting.   If you have any questions or concerns, please don't hesitate to send me a message via MyChart or call the office at [phone]. Thank you for visiting with Brandy Cole today! It's our pleasure caring for you.  Think about Graves' disease treatment options.

## 2018-03-05 ENCOUNTER — Encounter: Payer: BC Managed Care – PPO | Admitting: Family Medicine

## 2018-03-06 ENCOUNTER — Other Ambulatory Visit (HOSPITAL_COMMUNITY)
Admission: RE | Admit: 2018-03-06 | Discharge: 2018-03-06 | Disposition: A | Discharge status: Home / Self Care | Payer: BC Managed Care – PPO | Source: Ambulatory Visit | Attending: Family Medicine | Admitting: Family Medicine

## 2018-03-06 ENCOUNTER — Other Ambulatory Visit: Payer: Self-pay

## 2018-03-06 ENCOUNTER — Ambulatory Visit (INDEPENDENT_AMBULATORY_CARE_PROVIDER_SITE_OTHER): Payer: BC Managed Care – PPO | Admitting: Family Medicine

## 2018-03-06 ENCOUNTER — Encounter: Payer: Self-pay | Admitting: Family Medicine

## 2018-03-06 VITALS — BP 120/76 | HR 72 | Temp 98.8°F | Ht 64.5 in | Wt 163.8 lb

## 2018-03-06 DIAGNOSIS — Z87898 Personal history of other specified conditions: Secondary | ICD-10-CM | POA: Diagnosis not present

## 2018-03-06 DIAGNOSIS — Z3041 Encounter for surveillance of contraceptive pills: Secondary | ICD-10-CM | POA: Diagnosis not present

## 2018-03-06 DIAGNOSIS — N760 Acute vaginitis: Secondary | ICD-10-CM | POA: Insufficient documentation

## 2018-03-06 DIAGNOSIS — Z0000 Encounter for general adult medical examination without abnormal findings: Secondary | ICD-10-CM | POA: Diagnosis not present

## 2018-03-06 DIAGNOSIS — E0500 Thyrotoxicosis with diffuse goiter without thyrotoxic crisis or storm: Secondary | ICD-10-CM

## 2018-03-06 LAB — CBC WITH DIFFERENTIAL/PLATELET
BASOS PCT: 0.5 % (ref 0.0–3.0)
Basophils Absolute: 0 10*3/uL (ref 0.0–0.1)
EOS PCT: 2.6 % (ref 0.0–5.0)
Eosinophils Absolute: 0.2 10*3/uL (ref 0.0–0.7)
HEMATOCRIT: 37 % (ref 36.0–46.0)
HEMOGLOBIN: 12.3 g/dL (ref 12.0–15.0)
LYMPHS PCT: 27.4 % (ref 12.0–46.0)
Lymphs Abs: 1.6 10*3/uL (ref 0.7–4.0)
MCHC: 33.1 g/dL (ref 30.0–36.0)
MCV: 93.7 fl (ref 78.0–100.0)
Monocytes Absolute: 0.4 10*3/uL (ref 0.1–1.0)
Monocytes Relative: 7.2 % (ref 3.0–12.0)
Neutro Abs: 3.7 10*3/uL (ref 1.4–7.7)
Neutrophils Relative %: 62.3 % (ref 43.0–77.0)
Platelets: 387 10*3/uL (ref 150.0–400.0)
RBC: 3.95 Mil/uL (ref 3.87–5.11)
RDW: 13.9 % (ref 11.5–15.5)
WBC: 5.9 10*3/uL (ref 4.0–10.5)

## 2018-03-06 LAB — COMPREHENSIVE METABOLIC PANEL
ALBUMIN: 4.2 g/dL (ref 3.5–5.2)
ALT: 11 U/L (ref 0–35)
AST: 12 U/L (ref 0–37)
Alkaline Phosphatase: 59 U/L (ref 39–117)
BUN: 11 mg/dL (ref 6–23)
CALCIUM: 9 mg/dL (ref 8.4–10.5)
CHLORIDE: 103 meq/L (ref 96–112)
CO2: 28 mEq/L (ref 19–32)
CREATININE: 0.83 mg/dL (ref 0.40–1.20)
GFR: 96.06 mL/min (ref >60.00)
Glucose, Bld: 100 mg/dL — ABNORMAL HIGH (ref 70–99)
POTASSIUM: 3.8 meq/L (ref 3.5–5.1)
Sodium: 137 mEq/L (ref 135–145)
Total Bilirubin: 0.6 mg/dL (ref 0.2–1.2)
Total Protein: 7.3 g/dL (ref 6.0–8.3)

## 2018-03-06 LAB — POCT GLYCOSYLATED HEMOGLOBIN (HGB A1C): HEMOGLOBIN A1C: 5.3 % (ref 4.0–5.6)

## 2018-03-06 LAB — LIPID PANEL
CHOL/HDL RATIO: 4
CHOLESTEROL: 241 mg/dL — AB (ref 0–200)
HDL: 59.2 mg/dL (ref >39.00)
LDL CALC: 170 mg/dL — AB (ref 0–99)
NONHDL: 182.02
TRIGLYCERIDES: 60 mg/dL (ref 0.0–149.0)
VLDL: 12 mg/dL (ref 0.0–40.0)

## 2018-03-06 LAB — TSH: TSH: 1.98 u[IU]/mL (ref 0.35–4.50)

## 2018-03-06 NOTE — Patient Instructions (Signed)
Please return in 12 months for your annual complete physical; please come fasting.  I'll send you a letter with your lab results or you can sign up for Mychart. I've sent you a link.  If you have any questions or concerns, please don't hesitate to send me a message via MyChart or call the office at [phone]. Thank you for visiting with [place] today! It's our pleasure caring for you.  Recommendations for women to keep healthy:   EXERCISE AND DIET: We recommended that you start or continue a regular exercise program for good health. Regular exercise means any activity that makes your heart beat faster and makes you sweat. We recommend exercising at least 30 minutes per day at least 3 days a week, preferably 4 or 5. We also recommend a diet low in fat and sugar. Inactivity, poor dietary choices and obesity can cause diabetes, heart attack, stroke, and kidney damage, among others.   ALCOHOL AND SMOKING: Women should limit their alcohol intake to no more than 7 drinks/beers/glasses of wine (combined, not each!) per week. Moderation of alcohol intake to this level decreases your risk of breast cancer and liver damage. And of course, no recreational drugs are part of a healthy lifestyle. And absolutely no smoking or even second hand smoke. Most people know smoking can cause heart and lung diseases, but did you know it also contributes to weakening of your bones? Aging of your skin? Yellowing of your teeth and nails?  CALCIUM AND VITAMIN D: Adequate intake of calcium and Vitamin D are recommended. The recommendations for exact amounts of these supplements seem to change often, but generally speaking 600 mg of calcium (either carbonate or citrate) and 800 units of Vitamin D per day seems prudent. Certain women may benefit from higher intake of Vitamin D. If you are among these women, your doctor will have told you during your visit.   PAP SMEARS: Pap smears, to check for cervical cancer or precancers, have  traditionally been done yearly, although recent scientific advances have shown that most women can have pap smears less often. However, every woman still should have a physical exam from her gynecologist every year. It will include a breast check, inspection of the vulva and vagina to check for abnormal growths or skin changes, a visual exam of the cervix, and then an exam to evaluate the size and shape of the uterus and ovaries. And after 44 years of age, a rectal exam is indicated to check for rectal cancers. We will also provide age appropriate advice regarding health maintenance, like when you should have certain vaccines, screening for sexually transmitted diseases, bone density testing, colonoscopy, mammograms, etc.   MAMMOGRAMS: All women over 44 years old should have a yearly mammogram. Many facilities now offer a "3D" mammogram, which may cost around $50 extra out of pocket. If possible, we recommend you accept the option to have the 3D mammogram performed. It both reduces the number of women who will be called back for extra views which then turn out to be normal, and it is better than the routine mammogram at detecting truly abnormal areas.   COLONOSCOPY: Colonoscopy to screen for colon cancer is recommended for all women at age 44. We know, you hate the idea of the prep. We agree, BUT, having colon cancer and not knowing it is worse!! Colon cancer so often starts as a polyp that can be seen and removed at colonscopy, which can quite literally save your life! And if your  first colonoscopy is normal and you have no family history of colon cancer, most women don't have to have it again for 10 years. Once every ten years, you can do something that may end up saving your life, right? We will be happy to help you get it scheduled when you are ready. Be sure to check your insurance coverage so you understand how much it will cost. It may be covered as a preventative service at no cost, but you should check  your particular policy.

## 2018-03-06 NOTE — Progress Notes (Signed)
Subjective  Chief Complaint  Patient presents with  . Annual Exam    doing well, declines flu shot  . Vaginal Discharge    on and off since July, white discharge     HPI: Brandy Cole is a 42 y.o. female who presents to [facility] today for a Female Wellness Visit. Had GYN wellness exam in January with Dr. Pennie Rushing. Nl pap and mammo reported.   Wellness Visit: annual visit with health maintenance review and exam without Pap   Doing well. Graves is stable. Feels well. On ocps.   H/o recurrent vaginitis - yeast or BV and treated with boric acid in the past. Has 4-5 month h/o homogenous discharge w/o odor, itching or pain. Regular menses. No h/o STDs  Reports elevated a1c 5.6 last year and would like it rechecked. Has gained a little of her weight back.   Assessment  1. Annual physical exam   2. Graves disease   3. Oral contraceptive use   4. Recurrent vaginitis   5. History of prediabetes      Plan  Female Wellness Visit:  Age appropriate Health Maintenance and Prevention measures were discussed with patient. Included topics are cancer screening recommendations, ways to keep healthy (see AVS) including dietary and exercise recommendations, regular eye and dental care, use of seat belts, and avoidance of moderate alcohol use and tobacco use.   BMI: discussed patient's BMI and encouraged positive lifestyle modifications to help get to or maintain a target BMI. Discussed diet and exercise and reassured,nl A1c today.  HM needs and immunizations were addressed and ordered. See below for orders. See HM and immunization section for updates.  Routine labs and screening tests ordered including cmp, cbc and lipids where appropriate.  Discussed recommendations regarding Vit D and calcium supplementation (see AVS)  Vaginitis: check for BV and treat if positive. May be physiologic on ocps.   Follow up: Return in about 1 year (around 03/07/2019) for  complete physical.   Orders Placed This Encounter  Procedures  . CBC with Differential/Platelet  . Comprehensive metabolic panel  . Lipid panel  . HIV Antibody (routine testing w rflx)  . TSH  . POCT glycosylated hemoglobin (Hb A1C)   No orders of the defined types were placed in this encounter.     Lifestyle: Body mass index is 27.68 kg/m. Wt Readings from Last 3 Encounters:  03/06/18 163 lb 12.5 oz (74.3 kg)  11/13/17 157 lb 3.2 oz (71.3 kg)  06/22/17 180 lb (81.6 kg)   Diet: low fat Exercise: frequently,  Need for contraception: Yes, OCP (estrogen/progesterone)  Patient Active Problem List   Diagnosis Date Noted  . 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 Recurrent vaginitis 02/02/2016  . Graves disease 03/24/2015  . Multiple thyroid nodules 10/28/2014  . Oral contraceptive use 10/19/2013  . Chronic daily headache 07/15/2013  . FH: premature coronary heart disease 04/23/2012  . Insomnia 12/18/2010    Overview:  Sleep study done. No apnea   . Migraine 10/25/2010    Overview:  Neurology - in [facility]; nl CT scan in past.    Health Maintenance  Topic Date Due  . HIV Screening  03/24/1989  . INFLUENZA VACCINE  03/06/2019 (Originally 11/20/2017)  . MAMMOGRAM  04/22/2018  . PAP SMEAR  04/22/2022  . TETANUS/TDAP  10/20/2023   Immunization History  Administered Date(s) Administered  . Hepatitis A, Ped/Adol-2 Dose 04/29/2014  . Tdap 10/19/2013   We updated and reviewed the patient's past  history in detail and it is documented below. Allergies: Patient is allergic to methimazole; voltaren  [diclofenac sodium]; and voltaren  [diclofenac]. Past Medical History Patient  has a past medical history of FHx: premature coronary heart disease, H/O chlamydia infection, Migraine, and Thyroid disease. Past Surgical History Patient  has a past surgical history that includes Wisdom tooth extraction. Family History: Patient family history includes Diabetes in her father; Heart disease  in her father; Hyperlipidemia in her father and mother. Social History:  Patient  reports that she has never smoked. She has never used smokeless tobacco. She reports that she does not drink alcohol or use drugs.  Review of Systems: Constitutional: negative for fever or malaise Ophthalmic: negative for photophobia, double vision or loss of vision Cardiovascular: negative for chest pain, dyspnea on exertion, or new LE swelling Respiratory: negative for SOB or persistent cough Gastrointestinal: negative for abdominal pain, change in bowel habits or melena Genitourinary: negative for dysuria or gross hematuria, no abnormal uterine bleeding or disharge Musculoskeletal: negative for new gait disturbance or muscular weakness Integumentary: negative for new or persistent rashes, no breast lumps Neurological: negative for TIA or stroke symptoms Psychiatric: negative for SI or delusions Allergic/Immunologic: negative for hives Patient Care Team    Relationship Specialty Notifications Start End  Willow OraAndy, Camille L, MD PCP - General Family Medicine  10/31/17   Hal MoralesHaygood, Vanessa P, MD Consulting Physician Obstetrics and Gynecology  11/13/17   Berdine DanceSnow, Peter E, NP Nurse Practitioner Bariatrics  11/13/17   Daiva NakayamaHairston, Kristen, MD Referring Physician Internal Medicine  11/13/17   Gelene MinkKoop, Timothy, OD Referring Physician Optometry  03/06/18     Objective  Vitals: BP 120/76   Pulse 72   Temp 98.8 F (37.1 C)   Ht 5' 4.5" (1.638 m)   Wt 163 lb 12.5 oz (74.3 kg)   SpO2 98%   BMI 27.68 kg/m  General:  Well developed, well nourished, no acute distress  Psych:  Alert and orientedx3,normal mood and affect HEENT:  Normocephalic, atraumatic, non-icteric sclera, PERRL, oropharynx is clear without mass or exudate, supple neck without adenopathy, mass or thyromegaly Cardiovascular:  Normal S1, S2, RRR without gallop, rub or murmur, nondisplaced PMI Respiratory:  Good breath sounds bilaterally, CTAB with normal  respiratory effort Gastrointestinal: normal bowel sounds, soft, non-tender, no noted masses. No HSM MSK: no deformities, contusions. Joints are without erythema or swelling. Spine and CVA region are nontender Skin:  Warm, no rashes or suspicious lesions noted Neurologic:    Mental status is normal. CN 2-11 are normal. Gross motor and sensory exams are normal. Normal gait. No tremor  Lab Results  Component Value Date   HGBA1C 5.3 03/06/2018      Commons side effects, risks, benefits, and alternatives for medications and treatment plan prescribed today were discussed, and the patient expressed understanding of the given instructions. Patient is instructed to call or message via MyChart if he/she has any questions or concerns regarding our treatment plan. No barriers to understanding were identified. We discussed Red Flag symptoms and signs in detail. Patient expressed understanding regarding what to do in case of urgent or emergency type symptoms.   Medication list was reconciled, printed and provided to the patient in AVS. Patient instructions and summary information was reviewed with the patient as documented in the AVS. This note was prepared with assistance of Dragon voice recognition software. Occasional wrong-word or sound-a-like substitutions may have occurred due to the inherent limitations of voice recognition software

## 2018-03-07 LAB — HIV ANTIBODY (ROUTINE TESTING W REFLEX): HIV 1&2 Ab, 4th Generation: NONREACTIVE

## 2018-03-09 LAB — CERVICOVAGINAL ANCILLARY ONLY
Bacterial vaginitis: POSITIVE — AB
Candida vaginitis: POSITIVE — AB
TRICH (WINDOWPATH): NEGATIVE

## 2018-03-10 MED ORDER — METRONIDAZOLE 500 MG PO TABS: 500.0000 mg | ORAL_TABLET | Freq: Two times a day (BID) | ORAL | 0 refills | Status: AC

## 2018-03-10 MED ORDER — FLUCONAZOLE 150 MG PO TABS: ORAL_TABLET | ORAL | 0 refills | Status: DC

## 2018-03-10 NOTE — Addendum Note (Signed)
Addended by: Asencion PartridgeANDY, CAMILLE on: 03/10/2018 08:56 AM   Modules accepted: Orders

## 2018-04-22 LAB — HM MAMMOGRAPHY

## 2018-10-08 ENCOUNTER — Ambulatory Visit: Payer: Self-pay

## 2018-10-08 NOTE — Telephone Encounter (Signed)
Pt. Called with report of known exposure to COVID at work last [employer].  She was just made aware of confirmation of Co-worker testing positive.  Today, c/o onset of fatigue, headache, sore throat, and infreq. Cough.  Denied fever or shortness of breath.    Transferred pt. To the office to be scheduled for Virtual Visit.  Pt. Agreed with plan.   Reason for Disposition . [1] COVID-19 infection suspected by caller or triager AND [2] mild symptoms (cough, fever, or others) AND [3] no complications or SOB  Answer Assessment - Initial Assessment Questions 1. CLOSE CONTACT: "Who is the person with the confirmed or suspected COVID-19 infection that you were exposed to?"     Coworker in office across hall 2. PLACE of CONTACT: "Where were you when you were exposed to COVID-19?" (e.g., home, school, medical waiting room; which city?)     [place] of [place] [employer] 3. TYPE of CONTACT: "How much contact was there?" (e.g., sitting next to, live in same house, work in same office, same building)     In same office building and in close proximity at times  4. DURATION of CONTACT: "How long were you in contact with the COVID-19 patient?" (e.g., a few seconds, passed by person, a few minutes, live with the patient)     Varies  5. DATE of CONTACT: "When did you have contact with a COVID-19 patient?" (e.g., how many days ago)     6/11 6. TRAVEL: "Have you traveled out of the country recently?" If so, "When and where?"     * Also ask about out-of-state travel, since the CDC has identified some high-risk cities for community spread in the [place].     * Note: Travel becomes less relevant if there is widespread community transmission where the patient lives.     Denied travel 7. COMMUNITY SPREAD: "Are there lots of cases of COVID-19 (community spread) where you live?" (See public health department website, if unsure)       Present in community 8. SYMPTOMS: "Do you have any symptoms?" (e.g., fever, cough, breathing  difficulty)     Onset of feeling tired, sore throat, headache ; infreq cough 9. PREGNANCY OR POSTPARTUM: "Is there any chance you are pregnant?" "When was your last menstrual period?" "Did you deliver in the last 2 weeks?"     LMP; 2 weeks ago 10. HIGH RISK: "Do you have any heart or lung problems? Do you have a weak immune system?" (e.g., CHF, COPD, asthma, HIV positive, chemotherapy, renal failure, diabetes mellitus, sickle cell anemia)      Denied any chronic illness; has thyroid disease  Answer Assessment - Initial Assessment Questions 1. COVID-19 DIAGNOSIS: "Who made your Coronavirus (COVID-19) diagnosis?" "Was it confirmed by a positive lab test?" If not diagnosed by a HCP, ask "Are there lots of cases (community spread) where you live?" (See public health department website, if unsure)     Present in Community; reported exposure at work to known case 2. ONSET: "When did the COVID-19 symptoms start?"      Today, 6/18 3. WORST SYMPTOM: "What is your worst symptom?" (e.g., cough, fever, shortness of breath, muscle aches)    Headache, fatigue, sore throat, infreq. cough 4. COUGH: "Do you have a cough?" If so, ask: "How bad is the cough?"       Infreq. 5. FEVER: "Do you have a fever?" If so, ask: "What is your temperature, how was it measured, and when did it start?"  denied 6. RESPIRATORY STATUS: "Describe your breathing?" (e.g., shortness of breath, wheezing, unable to speak)      denied 7. BETTER-SAME-WORSE: "Are you getting better, staying the same or getting worse compared to yesterday?"  If getting worse, ask, "In what way?"     Onset of symptoms today 8. HIGH RISK DISEASE: "Do you have any chronic medical problems?" (e.g., asthma, heart or lung disease, weak immune system, etc.)     Thyroid disease, otherwise healthy 9. PREGNANCY: "Is there any chance you are pregnant?" "When was your last menstrual period?"     LMP about 2 wks. Ago 10. OTHER SYMPTOMS: "Do you have any other  symptoms?"  (e.g., chills, fatigue, headache, loss of smell or taste, muscle pain, sore throat)       H/A , fatigue, sore throat, infreq cough  Protocols used: CORONAVIRUS (COVID-19) DIAGNOSED OR SUSPECTED-A-AH, CORONAVIRUS (COVID-19) EXPOSURE-A-AH

## 2018-10-09 ENCOUNTER — Other Ambulatory Visit: Payer: Self-pay

## 2018-10-09 ENCOUNTER — Telehealth: Payer: Self-pay

## 2018-10-09 ENCOUNTER — Telehealth: Payer: Self-pay | Admitting: *Deleted

## 2018-10-09 ENCOUNTER — Other Ambulatory Visit: Payer: BC Managed Care – PPO

## 2018-10-09 ENCOUNTER — Ambulatory Visit (INDEPENDENT_AMBULATORY_CARE_PROVIDER_SITE_OTHER): Payer: BC Managed Care – PPO | Admitting: Family Medicine

## 2018-10-09 ENCOUNTER — Encounter: Payer: Self-pay | Admitting: Family Medicine

## 2018-10-09 VITALS — Temp 98.6°F

## 2018-10-09 DIAGNOSIS — Z20828 Contact with and (suspected) exposure to other viral communicable diseases: Secondary | ICD-10-CM | POA: Diagnosis not present

## 2018-10-09 DIAGNOSIS — Z20822 Contact with and (suspected) exposure to covid-19: Secondary | ICD-10-CM

## 2018-10-09 NOTE — Telephone Encounter (Signed)
Scheduled for today.

## 2018-10-09 NOTE — Telephone Encounter (Signed)
Pt seen in office today, needing COVID testing

## 2018-10-09 NOTE — Progress Notes (Signed)
     Virtual Visit via Video Note  Subjective  CC:  Chief Complaint  Patient presents with  . COVID Exposure    She reports that [place] Having symptoms of sore throat, weakness, joint pain;Cough;Diarrhea     I connected with Roxana Hires on 10/09/18 at  1:40 PM EDT by a video enabled telemedicine application and verified that I am speaking with the correct person using two identifiers. Location patient: Home Location provider: [facility], Office Persons participating in the virtual visit: Brandy Cole, Leamon Arnt, MD Lilli Light, Forrest discussed the limitations of evaluation and management by telemedicine and the availability of in person appointments. The patient expressed understanding and agreed to proceed. HPI: Brandy Cole is a 43 y.o. female who was contacted today to address the problems listed above in the chief complaint. Marland Kitchen Has had casual contact with coworker who is covid +. Works in same office as Mud[employer]; they have been wearing masks. Contact has been casual: walking by in hallway etc. No face to face interactions w/o mask. Pt developed sxs as noted above yesterday w/o fever or sob.   Assessment  1. Exposure to Covid-19 Virus      Plan   Exposure to covid, with sxs:  Recommend home self isolation, supportive care, symptom monitoring, and will refer to [facility] for Covid -19 testing. Pt is not to report to work until testing is negative or she is 3 days w/o sxs.  We will f/u via mychart next week.  I discussed the assessment and treatment plan with the patient. The patient was provided an opportunity to ask questions and all were answered. The patient agreed with the plan and demonstrated an understanding of the instructions.   The patient was advised to call back or seek an in-person evaluation if the symptoms worsen or if the condition fails to improve as anticipated. Follow up: No follow-ups on file.   03/08/2019  No orders of the defined types were placed in this encounter.     I reviewed the patients updated PMH, FH, and SocHx.    Patient Active Problem List   Diagnosis Date Noted  . 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 Recurrent vaginitis 02/02/2016  . Graves disease 03/24/2015  . Multiple thyroid nodules 10/28/2014  . Oral contraceptive use 10/19/2013  . Chronic daily headache 07/15/2013  . FH: premature coronary heart disease 04/23/2012  . Insomnia 12/18/2010  . Migraine 10/25/2010   Current Meds  Medication Sig  . cycloSPORINE (RESTASIS) 0.05 % ophthalmic emulsion Restasis 0.05 % eye drops in a dropperette  INT 1 GTT INTO OU BID  . Probiotic CAPS Take by mouth.    Allergies: Patient is allergic to methimazole; voltaren  [diclofenac sodium]; and voltaren  [diclofenac]. Family History: Patient family history includes Diabetes in her father; Heart disease in her father; Hyperlipidemia in her father and mother. Social History:  Patient  reports that she has never smoked. She has never used smokeless tobacco. She reports that she does not drink alcohol or use drugs.  Review of Systems: Constitutional: Negative for fever malaise or anorexia Cardiovascular: negative for chest pain Respiratory: negative for SOB or persistent cough Gastrointestinal: negative for abdominal pain  OBJECTIVE Vitals: Temp 98.6 F (37 C) (Oral)  General: no acute distress , A&Ox3, appears well  Leamon Arnt, MD

## 2018-10-09 NOTE — Telephone Encounter (Signed)
Patient called and advised of the request for covid testing, she verbalized understanding. Appointment scheduled for today at 1445 at [facility], advised of location and to wear a mask for everyone in the vehicle, she verbalized understanding. Order placed.

## 2018-10-15 LAB — NOVEL CORONAVIRUS, NAA: SARS-CoV-2, NAA: NOT DETECTED

## 2018-10-16 ENCOUNTER — Encounter: Payer: Self-pay | Admitting: Family Medicine

## 2018-10-16 ENCOUNTER — Telehealth: Payer: Self-pay

## 2018-10-16 NOTE — Telephone Encounter (Signed)
LMOVM that letter has been sent to [facility]

## 2018-10-16 NOTE — Telephone Encounter (Signed)
Copied from [place] [phone]. Topic: General - Other >> Oct 15, 2018  5:20 PM Marin Olp L wrote: Reason for CRM: Patient needs a return to work clearance letter stating when the test was done, when it was resulted and the results. Patient would like this uploaded to mychart.

## 2019-03-08 ENCOUNTER — Other Ambulatory Visit (HOSPITAL_COMMUNITY)
Admission: RE | Admit: 2019-03-08 | Discharge: 2019-03-08 | Disposition: A | Discharge status: Home / Self Care | Payer: BC Managed Care – PPO | Source: Ambulatory Visit | Attending: Family Medicine | Admitting: Family Medicine

## 2019-03-08 ENCOUNTER — Other Ambulatory Visit: Payer: Self-pay

## 2019-03-08 ENCOUNTER — Encounter: Payer: Self-pay | Admitting: Family Medicine

## 2019-03-08 ENCOUNTER — Ambulatory Visit (INDEPENDENT_AMBULATORY_CARE_PROVIDER_SITE_OTHER): Payer: BC Managed Care – PPO | Admitting: Family Medicine

## 2019-03-08 VITALS — BP 126/82 | HR 82 | Temp 98.5°F | Ht 63.0 in | Wt 183.0 lb

## 2019-03-08 DIAGNOSIS — N898 Other specified noninflammatory disorders of vagina: Secondary | ICD-10-CM | POA: Diagnosis not present

## 2019-03-08 DIAGNOSIS — N760 Acute vaginitis: Secondary | ICD-10-CM | POA: Insufficient documentation

## 2019-03-08 DIAGNOSIS — Z0000 Encounter for general adult medical examination without abnormal findings: Secondary | ICD-10-CM | POA: Diagnosis not present

## 2019-03-08 DIAGNOSIS — E0500 Thyrotoxicosis with diffuse goiter without thyrotoxic crisis or storm: Secondary | ICD-10-CM

## 2019-03-08 LAB — COMPREHENSIVE METABOLIC PANEL
ALT: 10 U/L (ref 0–35)
AST: 11 U/L (ref 0–37)
Albumin: 4.3 g/dL (ref 3.5–5.2)
Alkaline Phosphatase: 59 U/L (ref 39–117)
BUN: 9 mg/dL (ref 6–23)
CO2: 23 mEq/L (ref 19–32)
Calcium: 9.2 mg/dL (ref 8.4–10.5)
Chloride: 106 mEq/L (ref 96–112)
Creatinine, Ser: 0.85 mg/dL (ref 0.40–1.20)
GFR: 87.53 mL/min (ref >60.00)
Glucose, Bld: 111 mg/dL — ABNORMAL HIGH (ref 70–99)
Potassium: 3.8 mEq/L (ref 3.5–5.1)
Sodium: 137 mEq/L (ref 135–145)
Total Bilirubin: 1.1 mg/dL (ref 0.2–1.2)
Total Protein: 7.4 g/dL (ref 6.0–8.3)

## 2019-03-08 LAB — LIPID PANEL
Cholesterol: 225 mg/dL — ABNORMAL HIGH (ref 0–200)
HDL: 44 mg/dL (ref >39.00)
LDL Cholesterol: 164 mg/dL — ABNORMAL HIGH (ref 0–99)
NonHDL: 181
Total CHOL/HDL Ratio: 5
Triglycerides: 87 mg/dL (ref 0.0–149.0)
VLDL: 17.4 mg/dL (ref 0.0–40.0)

## 2019-03-08 LAB — CBC WITH DIFFERENTIAL/PLATELET
Basophils Absolute: 0 10*3/uL (ref 0.0–0.1)
Basophils Relative: 0.3 % (ref 0.0–3.0)
Eosinophils Absolute: 0.1 10*3/uL (ref 0.0–0.7)
Eosinophils Relative: 1.8 % (ref 0.0–5.0)
HCT: 38.5 % (ref 36.0–46.0)
Hemoglobin: 13 g/dL (ref 12.0–15.0)
Lymphocytes Relative: 23.3 % (ref 12.0–46.0)
Lymphs Abs: 1.3 10*3/uL (ref 0.7–4.0)
MCHC: 33.9 g/dL (ref 30.0–36.0)
MCV: 92.5 fl (ref 78.0–100.0)
Monocytes Absolute: 0.4 10*3/uL (ref 0.1–1.0)
Monocytes Relative: 6.9 % (ref 3.0–12.0)
Neutro Abs: 3.9 10*3/uL (ref 1.4–7.7)
Neutrophils Relative %: 67.7 % (ref 43.0–77.0)
Platelets: 357 10*3/uL (ref 150.0–400.0)
RBC: 4.16 Mil/uL (ref 3.87–5.11)
RDW: 13.3 % (ref 11.5–15.5)
WBC: 5.8 10*3/uL (ref 4.0–10.5)

## 2019-03-08 LAB — HEMOGLOBIN A1C: Hgb A1c MFr Bld: 5.9 % (ref 4.6–6.5)

## 2019-03-08 LAB — TSH: TSH: 0.5 u[IU]/mL (ref 0.35–4.50)

## 2019-03-08 NOTE — Progress Notes (Signed)
Subjective  Chief Complaint  Patient presents with  . Annual Exam    HPI: Brandy Cole is a 43 y.o. female who presents to [place] at [facility] today for a Female Wellness Visit.  She also has the concerns and/or needs as listed above in the chief complaint. These will be addressed in addition to the Health Maintenance Visit.   Wellness Visit: annual visit with health maintenance review and exam without Pap   HM: sees gyn. mammo due in January. Doing well. Seeing bariatric specialist for weight loss: on topamax and low dose phentermine.   Stopped OCPs due to nausea. Still having regular menses.   Chronic disease management visit and/or acute problem visit:  Graves: stable by report. On PTU [facility]  H/o recurrent BV: treated recently x 2 by [facility], has recurrent sxs x 3 weeks. No sores or pain or itching.   Assessment  1. Annual physical exam   2. Graves disease   3. Vaginal discharge   4. Recurrent vaginitis      Plan  Female Wellness Visit:  Age appropriate Health Maintenance and Prevention measures were discussed with patient. Included topics are cancer screening recommendations, ways to keep healthy (see AVS) including dietary and exercise recommendations, regular eye and dental care, use of seat belts, and avoidance of moderate alcohol use and tobacco use. Screens up to date  BMI: discussed patient's BMI and encouraged positive lifestyle modifications to help get to or maintain a target BMI.  HM needs and immunizations were addressed and ordered. See below for orders. See HM and immunization section for updates. Declines flu vaccination  Routine labs and screening tests ordered including cmp, cbc and lipids where appropriate.  Discussed recommendations regarding Vit D and calcium supplementation (see AVS)  Chronic disease f/u and/or acute problem visit: (deemed necessary to be done in addition to the wellness visit):  BV:  Recheck  for infection and treat if positive.   Graves per endocrine.  Family planning: pt to use condoms for now.   Weight mgt per bariatrics.   Headaches are stable.   Follow up: Return in about 1 year (around 03/07/2020) for complete physical.   Orders Placed This Encounter  Procedures  . CBC w/Diff  . CMP  . Lipids  . TSH   No orders of the defined types were placed in this encounter.     Lifestyle: Body mass index is 32.42 kg/m. Wt Readings from Last 3 Encounters:  03/08/19 183 lb (83 kg)  03/06/18 163 lb 12.5 oz (74.3 kg)  11/13/17 157 lb 3.2 oz (71.3 kg)     Patient Active Problem List   Diagnosis Date Noted  . 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 Recurrent vaginitis 02/02/2016  . Graves disease 03/24/2015  . Multiple thyroid nodules 10/28/2014  . Chronic daily headache 07/15/2013  . FH: premature coronary heart disease 04/23/2012  . Insomnia 12/18/2010    Overview:  Sleep study done. No apnea   . Migraine 10/25/2010    Overview:  Neurology - in [facility]; nl CT scan in past.    Health Maintenance  Topic Date Due  . INFLUENZA VACCINE  07/21/2019 (Originally 11/21/2018)  . MAMMOGRAM  04/23/2019  . PAP SMEAR-Modifier  05/21/2022  . TETANUS/TDAP  10/20/2023  . HIV Screening  Completed   Immunization History  Administered Date(s) Administered  . Hepatitis A, Ped/Adol-2 Dose 04/29/2014  . Tdap 10/19/2013   We updated and reviewed the patient's past history in detail  and it is documented below. Allergies: Patient  reports no history of alcohol use. Past Medical History Patient  has a past medical history of FHx: premature coronary heart disease, H/O chlamydia infection, Migraine, Oral contraceptive use (10/19/2013), and Thyroid disease. Past Surgical History Patient  has a past surgical history that includes Wisdom tooth extraction. Social: nonsmoker, nondrinker, Family History  Problem Relation Age of Onset  . Heart disease Father   . Hyperlipidemia Father    . Diabetes Father   . Hyperlipidemia Mother     Review of Systems: Constitutional: negative for fever or malaise Ophthalmic: negative for photophobia, double vision or loss of vision Cardiovascular: negative for chest pain, dyspnea on exertion, or new LE swelling Respiratory: negative for SOB or persistent cough Gastrointestinal: negative for abdominal pain, change in bowel habits or melena Genitourinary: negative for dysuria or gross hematuria, no abnormal uterine bleeding or disharge Musculoskeletal: negative for new gait disturbance or muscular weakness Integumentary: negative for new or persistent rashes, no breast lumps Neurological: negative for TIA or stroke symptoms Psychiatric: negative for SI or delusions Allergic/Immunologic: negative for hives  Patient Care Team    Relationship Specialty Notifications Start End  Willow Ora, MD PCP - General Family Medicine  10/31/17   Hal Morales, MD (Inactive) Consulting Physician Obstetrics and Gynecology  11/13/17   Berdine Dance, NP Nurse Practitioner Bariatrics  11/13/17   Daiva Nakayama, MD Referring Physician Internal Medicine  11/13/17   Gelene Mink, OD Referring Physician Optometry  03/06/18     Objective  Vitals: BP 126/82 (BP Location: Left Arm, Patient Position: Sitting, Cuff Size: Normal)   Pulse 82   Temp 98.5 F (36.9 C) (Temporal)   Ht 5\' 3"  (1.6 m)   Wt 183 lb (83 kg)   SpO2 99%   BMI 32.42 kg/m  General:  Well developed, well nourished, no acute distress  Psych:  Alert and orientedx3,normal mood and affect HEENT:  Normocephalic, atraumatic, non-icteric sclera, PERRL, oropharynx is clear without mass or exudate, supple neck without adenopathy, mass or thyromegaly Cardiovascular:  Normal S1, S2, RRR without gallop, rub or murmur, nondisplaced PMI Respiratory:  Good breath sounds bilaterally, CTAB with normal respiratory effort Gastrointestinal: normal bowel sounds, soft, non-tender, no noted masses.  No HSM MSK: no deformities, contusions. Joints are without erythema or swelling. Spine and CVA region are nontender Skin:  Warm, no rashes or suspicious lesions noted Neurologic:    Mental status is normal. CN 2-11 are normal. Gross motor and sensory exams are normal. Normal gait. No tremor Breast Exam: No mass, skin retraction or nipple discharge is appreciated in either breast. No axillary adenopathy. Fibrocystic changes are not noted     Commons side effects, risks, benefits, and alternatives for medications and treatment plan prescribed today were discussed, and the patient expressed understanding of the given instructions. Patient is instructed to call or message via MyChart if he/she has any questions or concerns regarding our treatment plan. No barriers to understanding were identified. We discussed Red Flag symptoms and signs in detail. Patient expressed understanding regarding what to do in case of urgent or emergency type symptoms.   Medication list was reconciled, printed and provided to the patient in AVS. Patient instructions and summary information was reviewed with the patient as documented in the AVS. This note was prepared with assistance of Dragon voice recognition software. Occasional wrong-word or sound-a-like substitutions may have occurred due to the inherent limitations of voice recognition software

## 2019-03-08 NOTE — Patient Instructions (Signed)
Please return in 12 months for your annual complete physical; please come fasting.  I will release your lab results to you on your MyChart account with further instructions. Please reply with any questions.    If you have any questions or concerns, please don't hesitate to send me a message via MyChart or call the office at [phone]. Thank you for visiting with [place] today! It's our pleasure caring for you.   Preventive Care 24-45 Years Old, Female Preventive care refers to visits with your health care provider and lifestyle choices that can promote health and wellness. This includes:  A yearly physical exam. This may also be called an annual well check.  Regular dental visits and eye exams.  Immunizations.  Screening for certain conditions.  Healthy lifestyle choices, such as eating a healthy diet, getting regular exercise, not using drugs or products that contain nicotine and tobacco, and limiting alcohol use. What can I expect for my preventive care visit? Physical exam Your health care provider will check your:  Height and weight. This may be used to calculate body mass index (BMI), which tells if you are at a healthy weight.  Heart rate and blood pressure.  Skin for abnormal spots. Counseling Your health care provider may ask you questions about your:  Alcohol, tobacco, and drug use.  Emotional well-being.  Home and relationship well-being.  Sexual activity.  Eating habits.  Work and work Statistician.  Method of birth control.  Menstrual cycle.  Pregnancy history. What immunizations do I need?  Influenza (flu) vaccine  This is recommended every year. Tetanus, diphtheria, and pertussis (Tdap) vaccine  You may need a Td booster every 10 years. Varicella (chickenpox) vaccine  You may need this if you have not been vaccinated. Zoster (shingles) vaccine  You may need this after age 72. Measles, mumps, and rubella (MMR) vaccine  You may need at least  one dose of MMR if you were born in 1957 or later. You may also need a second dose. Pneumococcal conjugate (PCV13) vaccine  You may need this if you have certain conditions and were not previously vaccinated. Pneumococcal polysaccharide (PPSV23) vaccine  You may need one or two doses if you smoke cigarettes or if you have certain conditions. Meningococcal conjugate (MenACWY) vaccine  You may need this if you have certain conditions. Hepatitis A vaccine  You may need this if you have certain conditions or if you travel or work in places where you may be exposed to hepatitis A. Hepatitis B vaccine  You may need this if you have certain conditions or if you travel or work in places where you may be exposed to hepatitis B. Haemophilus influenzae type b (Hib) vaccine  You may need this if you have certain conditions. Human papillomavirus (HPV) vaccine  If recommended by your health care provider, you may need three doses over 6 months. You may receive vaccines as individual doses or as more than one vaccine together in one shot (combination vaccines). Talk with your health care provider about the risks and benefits of combination vaccines. What tests do I need? Blood tests  Lipid and cholesterol levels. These may be checked every 5 years, or more frequently if you are over 74 years old.  Hepatitis C test.  Hepatitis B test. Screening  Lung cancer screening. You may have this screening every year starting at age 77 if you have a 30-pack-year history of smoking and currently smoke or have quit within the past 15 years.  Colorectal  cancer screening. All adults should have this screening starting at age 30 and continuing until age 40. Your health care provider may recommend screening at age 76 if you are at increased risk. You will have tests every 1-10 years, depending on your results and the type of screening test.  Diabetes screening. This is done by checking your blood sugar (glucose)  after you have not eaten for a while (fasting). You may have this done every 1-3 years.  Mammogram. This may be done every 1-2 years. Talk with your health care provider about when you should start having regular mammograms. This may depend on whether you have a family history of breast cancer.  BRCA-related cancer screening. This may be done if you have a family history of breast, ovarian, tubal, or peritoneal cancers.  Pelvic exam and Pap test. This may be done every 3 years starting at age 26. Starting at age 14, this may be done every 5 years if you have a Pap test in combination with an HPV test. Other tests  Sexually transmitted disease (STD) testing.  Bone density scan. This is done to screen for osteoporosis. You may have this scan if you are at high risk for osteoporosis. Follow these instructions at home: Eating and drinking  Eat a diet that includes fresh fruits and vegetables, whole grains, lean protein, and low-fat dairy.  Take vitamin and mineral supplements as recommended by your health care provider.  Do not drink alcohol if: ? Your health care provider tells you not to drink. ? You are pregnant, may be pregnant, or are planning to become pregnant.  If you drink alcohol: ? Limit how much you have to 0-1 drink a day. ? Be aware of how much alcohol is in your drink. In the [place] one drink equals one 12 oz bottle of beer (355 mL), one 5 oz glass of wine (148 mL), or one 1 oz glass of hard liquor (44 mL). Lifestyle  Take daily care of your teeth and gums.  Stay active. Exercise for at least 30 minutes on 5 or more days each week.  Do not use any products that contain nicotine or tobacco, such as cigarettes, e-cigarettes, and chewing tobacco. If you need help quitting, ask your health care provider.  If you are sexually active, practice safe sex. Use a condom or other form of birth control (contraception) in order to prevent pregnancy and STIs (sexually transmitted  infections).  If told by your health care provider, take low-dose aspirin daily starting at age 27. What's next?  Visit your health care provider once a year for a well check visit.  Ask your health care provider how often you should have your eyes and teeth checked.  Stay up to date on all vaccines. This information is not intended to replace advice given to you by your health care provider. Make sure you discuss any questions you have with your health care provider. Document Released: 05/05/2015 Document Revised: 12/18/2017 Document Reviewed: 12/18/2017 Elsevier Patient Education  2020 [employer].

## 2019-03-08 NOTE — Addendum Note (Signed)
Addended by: Elmer Bales on: 03/08/2019 11:16 AM   Modules accepted: Orders

## 2019-03-09 LAB — CERVICOVAGINAL ANCILLARY ONLY
Bacterial Vaginitis (gardnerella): NEGATIVE
Candida Glabrata: NEGATIVE
Candida Vaginitis: NEGATIVE
Comment: NEGATIVE
Comment: NEGATIVE
Comment: NEGATIVE
Comment: NEGATIVE
Trichomonas: NEGATIVE

## 2020-03-10 ENCOUNTER — Ambulatory Visit (INDEPENDENT_AMBULATORY_CARE_PROVIDER_SITE_OTHER): Payer: BC Managed Care – PPO | Admitting: Family Medicine

## 2020-03-10 ENCOUNTER — Other Ambulatory Visit: Payer: Self-pay

## 2020-03-10 ENCOUNTER — Encounter: Payer: Self-pay | Admitting: Family Medicine

## 2020-03-10 VITALS — BP 140/82 | HR 99 | Temp 98.3°F | Wt 176.6 lb

## 2020-03-10 DIAGNOSIS — E0500 Thyrotoxicosis with diffuse goiter without thyrotoxic crisis or storm: Secondary | ICD-10-CM | POA: Diagnosis not present

## 2020-03-10 DIAGNOSIS — G253 Myoclonus: Secondary | ICD-10-CM | POA: Diagnosis not present

## 2020-03-10 LAB — CBC WITH DIFFERENTIAL/PLATELET: Total Lymphocyte: 27 %

## 2020-03-10 MED ORDER — CYCLOBENZAPRINE HCL 10 MG PO TABS: 10.0000 mg | ORAL_TABLET | Freq: Three times a day (TID) | ORAL | 0 refills | Status: DC | PRN

## 2020-03-10 NOTE — Patient Instructions (Signed)
I have placed an urgent referral to neurology for further evaluation.   I have ordered lab work and a Academic librarian.    Myoclonus Myoclonus is the sudden and quick movement of the muscles. It may include muscle jerks, twitches, or spasms that happen without a person's control (involuntary). There are different types of myoclonus. One type, called physiologic myoclonus, occurs normally in most people and rarely needs treatment. This type includes:  Hiccups.  Sleep starts, or twitching during sleep. Other types are caused by an underlying condition and may require treatment. What are the causes? The cause of this condition varies depending on the type of myoclonus. Physiologic myoclonus, such as hiccups, results from normal actions of the body. Other causes include:  Genetic condition or disease. This causes a type of myoclonus called essential myoclonus.  Epilepsy with seizures. This causes epileptic myoclonus.  Other underlying conditions cause secondary or symptomatic myoclonus. These include: ? Side effects of certain medicines. ? Metabolic conditions. ? Head, brain, or spinal injuries. ? Stroke. ? Nervous system conditions such as dementia, Parkinson disease, and Alzheimer disease. ? Infections. ? Poisoning. ? Brain tumors. ? Not having enough oxygen. What are the signs or symptoms? The main symptom of this condition is sudden spasms, jerking, or uncontrollable movements. Symptoms:  May occur in only one area of the body or over the entire body.  May come and go and may vary in intensity.  May make it hard for you to eat, speak, or walk. How is this diagnosed?  This condition is diagnosed based on your medical history, a review of your symptoms, and a physical exam. You may also have tests, including:  Electroencephalogram (EEG). This test checks the electrical activity of the brain.  Electromyogram (EMG). This test measures electrical activity in the  muscles.  Imaging tests such as an MRI or CT scan.  Lumbar puncture (spinal tap) to check spinal fluid for infection or inflammation.  Blood tests.  Urine tests. How is this treated? Treatment for this condition depends on the underlying condition and the severity of your symptoms. Treatment may include:  Medicines, such as tranquilizer and anti-seizure medicines.  Injections of botulinum toxins.  Treating the underlying cause, such as having a tumor removed or taking antibiotic medicine for an infection. Physiologic myoclonus does not require treatment. Follow these instructions at home:  Take over-the-counter and prescription medicines only as told by your health care provider.  If you are taking tranquilizer or anti-seizure medicines, do not drive or use heavy machinery until your body adjusts to the medicine. These medicines may cause drowsiness.  Keep a journal of your symptoms and the things that seem to trigger them or make them worse. Also, try to identity the things that make them better. Share this information with your health care provider.  Keep all follow-up visits as told by your health care provider. This is important. Contact a health care provider if:  You have severe pain and medicines do not help.  You have problems taking your medicines.  Your twitches, jerks, or spasms get worse. Get help right away if:  You have a seizure.  You have a reaction to your medicines, such as a rash, trouble breathing, or swelling.  You have trouble staying alert. Summary  Myoclonus is the sudden and quick movement of the muscles. It may include muscle jerks, twitches, or spasms that happen without a person's control (involuntary).  There are different types of myoclonus. Physiologic myoclonus occurs normally in most  people and rarely needs treatment. Other types of myoclonus are caused by an underlying condition.  Treatment for this condition depends on the underlying  condition and the severity of your symptoms.  If you are taking tranquilizer or anti-seizure medicines, do not drive or use heavy machinery until your body adjusts to the medicine. These medicines may cause drowsiness. This information is not intended to replace advice given to you by your health care provider. Make sure you discuss any questions you have with your health care provider. Document Revised: 03/21/2017 Document Reviewed: 07/18/2016 Elsevier Patient Education  2020 [employer].

## 2020-03-10 NOTE — Progress Notes (Signed)
Subjective  CC:    Chief Complaint  Patient presents with  . Spasms    left upper back, started on Monday of this week    HPI: Brandy Cole is a 44 y.o. female who presents to the office today to address the problems listed above in the chief complaint.  44 yo right-handed female with 5-day history of left-sided upper back involuntary muscle twitching.  Almost constant.  Uncomfortable.  Reports came on suddenly.  No exacerbating factors identified.  Has used ibuprofen to help with discomfort.  Involuntary muscle movements persist throughout the day and night.  Somewhat interferes with sleep.  No other localized twitching.  No other neuro changes: Specifically no diplopia, dysarthria, hyperparesthesias, paresis, tremor or muscle spasms.  She denies recent trauma, exercise; she denies recent over-the-counter supplements.  Last Covid vaccine was in May.  She has not yet received the booster.  She denies fevers, chills, myalgias, nausea, vomiting, headaches.  She does have history of migraines.  These have been well controlled.  She uses Excedrin as needed.  She does have Graves' disease and is on PTU.  She reports that her most recent TSH was normal.  This is managed by endocrine.  She denies rash.  No myalgias.  No family history of neurologic conditions.  No personal history of weakness or muscle spasms.  She otherwise feels well.   Assessment  1. Myoclonus   2. Graves disease      Plan   Myoclonus: Prominent findings on exam.  Unclear etiology.  Urgent referral to neuro.  Check lab work.  Trial of muscle relaxer.  Follow up: Neuro Visit date not found  Orders Placed This Encounter  Procedures  . Sedimentation rate  . TSH  . T4, free  . T3  . Antinuclear Antib (ANA)  . COMPLETE METABOLIC PANEL WITH GFR  . Magnesium  . CK (Creatine Kinase)  . CBC with Differential/Platelet  . Ambulatory referral to Neurology   Meds ordered this encounter  Medications  . cyclobenzaprine  (FLEXERIL) 10 MG tablet    Sig: Take 1 tablet (10 mg total) by mouth 3 (three) times daily as needed for muscle spasms.    Dispense:  30 tablet    Refill:  0      I reviewed the patients updated PMH, FH, and SocHx.    Patient Active Problem List   Diagnosis Date Noted  . 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 Recurrent vaginitis 02/02/2016  . Graves disease 03/24/2015  . Multiple thyroid nodules 10/28/2014  . Chronic daily headache 07/15/2013  . FH: premature coronary heart disease 04/23/2012  . Insomnia 12/18/2010  . Migraine 10/25/2010   Current Meds  Medication Sig  . Phentermine HCl (LOMAIRA) 8 MG TABS Take 8 mg by mouth daily.  . Probiotic CAPS Take by mouth.  . propylthiouracil (PTU) 50 MG tablet Take 100 mg by mouth 2 (two) times daily.    Allergies: Patient is allergic to methimazole, voltaren  [diclofenac sodium], and voltaren  [diclofenac]. Family History: Patient family history includes Diabetes in her father; Heart disease in her father; Hyperlipidemia in her father and mother. Social History:  Patient  reports that she has never smoked. She has never used smokeless tobacco. She reports that she does not drink alcohol and does not use drugs.  Review of Systems: Constitutional: Negative for fever malaise or anorexia Cardiovascular: negative for chest pain Respiratory: negative for SOB or persistent cough Gastrointestinal: negative for abdominal pain  Objective  Vitals: BP 140/82   Pulse 99   Temp 98.3 F (36.8 C) (Temporal)   Wt 176 lb 9.6 oz (80.1 kg)   SpO2 98%   BMI 31.28 kg/m  General: no acute distress , A&Ox3, normal mental status, normal speech Neuro: Cranial nerves II through XII intact, +2 brisk upper extremity reflexes that are symmetric, no spasticity. Strength, 5 out of 5 bilateral upper extremities Left upper back muscles with involuntary contractions.  There is a rhythmic movement of her left shoulder.  Involuntary movements of her shoulder  elbow and hand are normal. Skin:  Warm, no rashes     Commons side effects, risks, benefits, and alternatives for medications and treatment plan prescribed today were discussed, and the patient expressed understanding of the given instructions. Patient is instructed to call or message via MyChart if he/she has any questions or concerns regarding our treatment plan. No barriers to understanding were identified. We discussed Red Flag symptoms and signs in detail. Patient expressed understanding regarding what to do in case of urgent or emergency type symptoms.   Medication list was reconciled, printed and provided to the patient in AVS. Patient instructions and summary information was reviewed with the patient as documented in the AVS. This note was prepared with assistance of Dragon voice recognition software. Occasional wrong-word or sound-a-like substitutions may have occurred due to the inherent limitations of voice recognition software  This visit occurred during the SARS-CoV-2 public health emergency.  Safety protocols were in place, including screening questions prior to the visit, additional usage of staff PPE, and extensive cleaning of exam room while observing appropriate contact time as indicated for disinfecting solutions.

## 2020-03-11 LAB — COMPLETE METABOLIC PANEL WITH GFR
Alkaline phosphatase (APISO): 42 U/L (ref 31–125)
CO2: 27 mmol/L (ref 20–32)
Calcium: 9.6 mg/dL (ref 8.6–10.2)
GFR, Est Non African American: 76 mL/min/{1.73_m2} (ref >=60)

## 2020-03-11 LAB — CBC WITH DIFFERENTIAL/PLATELET: Hemoglobin: 12.5 g/dL (ref 11.7–15.5)

## 2020-03-11 LAB — CK: Total CK: 117 U/L (ref 29–143)

## 2020-03-13 LAB — ANTI-NUCLEAR AB-TITER (ANA TITER): ANA Titer 1: 1:160 {titer} — ABNORMAL HIGH

## 2020-03-13 LAB — COMPLETE METABOLIC PANEL WITH GFR
AG Ratio: 1.3 (calc) (ref 1.0–2.5)
ALT: 11 U/L (ref 6–29)
AST: 12 U/L (ref 10–35)
Albumin: 4.1 g/dL (ref 3.6–5.1)
BUN: 11 mg/dL (ref 7–25)
Chloride: 103 mmol/L (ref 98–110)
Creat: 0.91 mg/dL (ref 0.50–1.10)
GFR, Est African American: 88 mL/min/{1.73_m2} (ref >=60)
Globulin: 3.1 g/dL (calc) (ref 1.9–3.7)
Glucose, Bld: 86 mg/dL (ref 65–99)
Potassium: 4.7 mmol/L (ref 3.5–5.3)
Sodium: 138 mmol/L (ref 135–146)
Total Bilirubin: 0.5 mg/dL (ref 0.2–1.2)
Total Protein: 7.2 g/dL (ref 6.1–8.1)

## 2020-03-13 LAB — CBC WITH DIFFERENTIAL/PLATELET
Absolute Monocytes: 389 cells/uL (ref 200–950)
Basophils Absolute: 32 cells/uL (ref 0–200)
Basophils Relative: 0.6 %
Eosinophils Absolute: 97 cells/uL (ref 15–500)
Eosinophils Relative: 1.8 %
HCT: 37.1 % (ref 35.0–45.0)
Lymphs Abs: 1458 cells/uL (ref 850–3900)
MCH: 30.6 pg (ref 27.0–33.0)
MCHC: 33.7 g/dL (ref 32.0–36.0)
MCV: 90.7 fL (ref 80.0–100.0)
MPV: 8.6 fL (ref 7.5–12.5)
Monocytes Relative: 7.2 %
Neutro Abs: 3424 cells/uL (ref 1500–7800)
Neutrophils Relative %: 63.4 %
Platelets: 404 10*3/uL — ABNORMAL HIGH (ref 140–400)
RBC: 4.09 10*6/uL (ref 3.80–5.10)
RDW: 12.3 % (ref 11.0–15.0)
WBC: 5.4 10*3/uL (ref 3.8–10.8)

## 2020-03-13 LAB — ANA: Anti Nuclear Antibody (ANA): POSITIVE — AB

## 2020-03-13 LAB — SEDIMENTATION RATE: Sed Rate: 9 mm/h (ref 0–20)

## 2020-03-13 LAB — T4, FREE: Free T4: 1.1 ng/dL (ref 0.8–1.8)

## 2020-03-13 LAB — T3: T3, Total: 159 ng/dL (ref 76–181)

## 2020-03-13 LAB — TSH: TSH: 0.76 mIU/L

## 2020-03-13 LAB — MAGNESIUM: Magnesium: 2 mg/dL (ref 1.5–2.5)

## 2020-03-15 ENCOUNTER — Encounter: Payer: Self-pay | Admitting: Neurology

## 2020-03-30 ENCOUNTER — Encounter: Payer: Self-pay | Admitting: Neurology

## 2020-03-30 ENCOUNTER — Ambulatory Visit: Payer: BC Managed Care – PPO | Admitting: Neurology

## 2020-03-30 ENCOUNTER — Other Ambulatory Visit: Payer: Self-pay

## 2020-03-30 VITALS — BP 166/97 | HR 87 | Resp 18 | Ht 64.0 in | Wt 179.0 lb

## 2020-03-30 DIAGNOSIS — M25512 Pain in left shoulder: Secondary | ICD-10-CM

## 2020-03-30 DIAGNOSIS — R259 Unspecified abnormal involuntary movements: Secondary | ICD-10-CM

## 2020-03-30 DIAGNOSIS — G8929 Other chronic pain: Secondary | ICD-10-CM | POA: Diagnosis not present

## 2020-03-30 DIAGNOSIS — G43709 Chronic migraine without aura, not intractable, without status migrainosus: Secondary | ICD-10-CM | POA: Diagnosis not present

## 2020-03-30 MED ORDER — NORTRIPTYLINE HCL 10 MG PO CAPS: ORAL_CAPSULE | ORAL | 3 refills | Status: DC

## 2020-03-30 NOTE — Patient Instructions (Signed)
Start neck physiotherapy  Nerve testing of the left arm  Start nortriptyline 10mg  at bedtime for 2 week, then increase to 2 tablet at bedtime

## 2020-03-30 NOTE — Progress Notes (Signed)
[facility] Neurology Division Clinic Note - Initial Visit   Date: 03/30/20  Brandy Cole MRN: [id number] DOB: 08-07-1975   Dear Dr. Mardelle Matte:  Thank you for your kind referral of Brandy Cole for consultation of left shoulder movement. Although her history is well known to you, please allow [place] to reiterate it for the purpose of our medical record. The patient was accompanied to the clinic by self.    History of Present Illness: Brandy Cole is a 44 y.o. right-handed female with Graves disease presenting for evaluation of left shoulder movement.   Starting in November 2021, she woke up with left shoulder muscle spasm and movement.  The movements are sporadic and can occur several times a day.  It is a slow movement, as if shrugging the shoulder or spasm.  No twitching.  She has some achy pain around her shoulder. No specific triggers. She was prescribed a muscle relaxant which reduced the severity of symptoms.  She was doing well until a week ago when her symptoms returned.  She denies any weakness, numbness, or tingling.  No vision changes.  She also has a brief episode of lip twitching which lasted a few hours.  She denies similar symptoms in the right arm or left leg. She was taking phenterimine for weight loss and stopped it about a month ago.   She has migraines for the past 10 years.  She has daily headache, described as throbbing.  She has associated nasuea, photophobia, and phonophobia at times.  Triggers include foods, sun, noise, sugars.  She was previously followed at [facility] and has been on a number of medications, most recently on Trokendi (memory changes).  She has not done neck PT.  She does not wish to do botox. Per notes, prior medications include: Current and past medications: ANALGESICS:Aspirin,BC Goody's,Excedrin,Hydrocodone,Tylenol,Tramadol ANTI-MIGRAINE:Imitrex tab,Maxalt,Relpax HEART/BP:   DECONGESTANT/ANTIHISTAMINE:Allegra,ClaritinSudafed,Zyrtec ANTI-NAUSEANT NSAIDS:Advil,,Aleve,Meloxicam,Naproxen MUSCLE RELAXANTS: Amrixs,Flexeril ANTI-CONVULSANTS:Depakote, Topamax, Trokendi, zonegran STEROIDS:Prednisone SLEEPING PILLS/TRANQUILIZERS:Ambien,Tylnol PM ANTI-DEPRESSANTS:Amitriptyline HERBAL: Coenzyme Q10, Magnesium FIBROMYALGIA:  HORMONAL: OTHER:  PROCEDURES FOR HEADACHES:  She works as an Pensions consultant at [employer].   Out-side paper records, electronic medical record, and images have been reviewed where available and summarized as:  Lab Results  Component Value Date   HGBA1C 5.9 03/08/2019   No results found for: [id number] Lab Results  Component Value Date   TSH 0.76 03/10/2020   Lab Results  Component Value Date   ESRSEDRATE 9 03/10/2020    Past Medical History:  Diagnosis Date  . FHx: premature coronary heart disease   . H/O chlamydia infection   . Migraine   . Oral contraceptive use 10/19/2013  . Thyroid disease     Past Surgical History:  Procedure Laterality Date  . WISDOM TOOTH EXTRACTION       Medications:  Outpatient Encounter Medications as of 03/30/2020  Medication Sig  . cyclobenzaprine (FLEXERIL) 10 MG tablet Take 1 tablet (10 mg total) by mouth 3 (three) times daily as needed for muscle spasms.  . norgestimate-ethinyl estradiol (ORTHO-CYCLEN) 0.25-35 MG-MCG tablet Take by mouth.  . Omega-3 Fatty Acids (FISH OIL) 1000 MG CAPS Fish Oil  1capsule po daily  . Phentermine HCl (LOMAIRA) 8 MG TABS Take 8 mg by mouth daily.  . Probiotic CAPS Take by mouth.  . propylthiouracil (PTU) 50 MG tablet Take 100 mg by mouth 2 (two) times daily.   No facility-administered encounter medications on file as of 03/30/2020.    Allergies:  Allergies  Allergen Reactions  .  Methimazole Itching  . Voltaren  [Diclofenac Sodium] Other (See Comments)  . Voltaren  [Diclofenac] Other (See Comments)    Family History: Family History  Problem Relation  Age of Onset  . Heart disease Father   . Hyperlipidemia Father   . Diabetes Father   . Hyperlipidemia Mother     Social History: Social History   Tobacco Use  . Smoking status: Never Smoker  . Smokeless tobacco: Never Used  Vaping Use  . Vaping Use: Never used  Substance Use Topics  . Alcohol use: No  . Drug use: No   Social History   Social History Narrative   Right handed   Drinks caffeine   Two story    Vital Signs:  BP (!) 166/97   Pulse 87   Resp 18   Ht 5\' 4"  (1.626 m)   Wt 179 lb (81.2 kg)   SpO2 100%   BMI 30.73 kg/m   Neurological Exam: MENTAL STATUS including orientation to time, place, person, recent and remote memory, attention span and concentration, language, and fund of knowledge is normal.  Speech is not dysarthric.  CRANIAL NERVES: II:  No visual field defects.  Unremarkable fundi.   III-IV-VI: Pupils equal round and reactive to light.  Normal conjugate, extra-ocular eye movements in all directions of gaze.  No nystagmus.  No ptosis.   V:  Normal facial sensation.    VII:  Normal facial symmetry and movements.   VIII:  Normal hearing and vestibular function.   IX-X:  Normal palatal movement.   XI:  Normal shoulder shrug and head rotation.   XII:  Normal tongue strength and range of motion, no deviation or fasciculation.  MOTOR:  Sporadic, slow, left shoulder elevation without association flexion/extension of the extremity.  Movement seems distractible and nonphysiologic at times.  No atrophy. No pronator drift.   Upper Extremity:  Right  Left  Deltoid  5/5   5/5   Biceps  5/5   5/5   Triceps  5/5   5/5   Infraspinatus 5/5  5/5  Medial pectoralis 5/5  5/5  Wrist extensors  5/5   5/5   Wrist flexors  5/5   5/5   Finger extensors  5/5   5/5   Finger flexors  5/5   5/5   Dorsal interossei  5/5   5/5   Abductor pollicis  5/5   5/5   Tone ([place] scale)  0  0   Lower Extremity:  Right  Left  Hip flexors  5/5   5/5   Hip extensors  5/5    5/5   Adductor 5/5  5/5  Abductor 5/5  5/5  Knee flexors  5/5   5/5   Knee extensors  5/5   5/5   Dorsiflexors  5/5   5/5   Plantarflexors  5/5   5/5   Toe extensors  5/5   5/5   Toe flexors  5/5   5/5   Tone ([place] scale)  0  0   MSRs:  Right        Left                  brachioradialis 2+  2+  biceps 2+  2+  triceps 2+  2+  patellar 2+  2+  ankle jerk 2+  2+  Hoffman no  no  plantar response down  down   SENSORY:  Normal and symmetric perception of light touch, pinprick, vibration, and  proprioception.  Romberg's sign absent.   COORDINATION/GAIT: Normal finger-to- nose-finger and heel-to-shin.  Intact rapid alternating movements bilaterally.  Gait narrow based and stable.  IMPRESSION: 1.  Right shoulder pain associated left shoulder elevation.  These movements are not consistent with myoclonus or dyskinesia.  Movement is slow and varies, as well as distractible. 2.  Chronic migraine, previously seen at [facility] and tried a number of medications.    PLAN/RECOMMENDATIONS:  1.  NCS/EMG of the left arm 2.  Start out-patient neck and left shoulder PT 3.  Offered a trial of nortriptyline 10mg  at bedtime for 2 week, then increase to 2 tablet at bedtime 4.  Stop phenteramine since this can cause abnormal movements  Return to clinic after testing  Thank you for allowing me to participate in patient's care.  If I can answer any additional questions, I would be pleased to do so.    Sincerely,    Michai Dieppa K. , DO

## 2020-04-19 ENCOUNTER — Other Ambulatory Visit: Payer: Self-pay

## 2020-04-19 ENCOUNTER — Ambulatory Visit: Payer: BC Managed Care – PPO | Attending: Neurology

## 2020-04-19 DIAGNOSIS — G43709 Chronic migraine without aura, not intractable, without status migrainosus: Secondary | ICD-10-CM

## 2020-04-19 DIAGNOSIS — M25512 Pain in left shoulder: Secondary | ICD-10-CM | POA: Diagnosis present

## 2020-04-19 DIAGNOSIS — G8929 Other chronic pain: Secondary | ICD-10-CM

## 2020-04-20 NOTE — Therapy (Addendum)
[place], [place], [address] Phone: [phone]   Fax:  [phone]  Physical Therapy Evaluation/Discharge Summary   Patient Details  Name: Brandy Cole MRN: [id number] Date of Birth: 09-23-1975 Referring Provider (PT): Alda Berthold, DO   Encounter Date: 04/19/2020   PT End of Session - 04/20/20 1048    Visit Number 1    Number of Visits 7    Date for PT Re-Evaluation 06/10/20    Authorization Type [place] PPO    PT Start Time 0923    PT Stop Time 1855    PT Time Calculation (min) 62 min    Activity Tolerance Patient tolerated treatment well    Behavior During Therapy [facility] for tasks assessed/performed           Past Medical History:  Diagnosis Date  . FHx: premature coronary heart disease   . H/O chlamydia infection   . Migraine   . Oral contraceptive use 10/19/2013  . Thyroid disease     Past Surgical History:  Procedure Laterality Date  . WISDOM TOOTH EXTRACTION      There were no vitals filed for this visit.    Subjective Assessment - 04/19/20 1808    Subjective L  shoulder for approx 1 month after sleeping wrong with spasms. Migranes daily for 25 years.  No ryme or reason to when it develops sometimes temple, forehead or neck, base of the head posterior.    Currently in Pain? Yes    Pain Score 5    controlled with medds daily   Pain Location Head    Multiple Pain Sites Yes    Pain Score 0    Pain Location Shoulder    Pain Orientation Left              [facility] PT Assessment - 04/20/20 0001      Assessment   Medical Diagnosis Chronic migraine without aura without status migrainosus, not intractable; chronic L shoulder pain; Abnormal movement    Referring Provider (PT) Alda Berthold, DO    Onset Date/Surgical Date --   Migranes 25 years. L shoulder 1 month.   Hand Dominance Right    Next MD Visit 05/16/20    Prior Therapy No      Precautions   Precautions  None      Restrictions   Weight Bearing Restrictions No      Balance Screen   Has the patient fallen in the past 6 months No      [place] residence    Living Arrangements Alone    Type of [place] to enter    Entrance Stairs-Number of Steps 6    Entrance Stairs-Rails Right;Left    Home Layout Two level    Alternate Level Stairs-Number of Steps 14    Alternate Level Stairs-Rails Right;Left      Prior Function   Level of Independence Independent    Vocation Full time employment    Event organiser- sitting with computer work      Cognition   Overall Cognitive Status Within Functional Limits for tasks assessed      Observation/Other Assessments   Focus on Therapeutic Outcomes (FOTO)  52% ability      Sensation   Light Touch Appears Intact      Posture/Postural Control   Posture/Postural Control Postural limitations    Postural Limitations Forward  head;Rounded Shoulders;Decreased thoracic kyphosis      ROM / Strength   AROM / PROM / Strength AROM      AROM   Overall AROM Comments Cervical ROMs are WFLS. With current migrane, HA was aggrevated with all cervical movements.      Palpation   Palpation comment Min. tenderness suboccipitipal triangles, cervical paraspinals and upper traps.      Transfers   Transfers Sit to Stand;Stand to Sit    Sit to Stand 7: Independent      Ambulation/Gait   Ambulation/Gait Yes    Gait Pattern Within Functional Limits;Step-through pattern                      Objective measurements completed on examination: See above findings.       [facility] Adult PT Treatment/Exercise - 04/20/20 0001      Manual Therapy   Manual Therapy Soft tissue mobilization;Manual Traction    Soft tissue mobilization To the upper trap and ervical paraspinals- min increased muscle tension.    Manual Traction Cervical traction, suboccipital release. Pt reported migrane HA  improved                  PT Education - 04/20/20 1051    Education Details Eval findings, POC, HEP, use of taped tennis balls for suboccipital massage and use of theracane for shoulder mid back massage, education re: proper set up for a computer station to reduce postural strain.    Person(s) Educated Patient    Methods Explanation;Demonstration;Tactile cues;Verbal cues;Handout    Comprehension Verbalized understanding;Returned demonstration;Verbal cues required;Tactile cues required;Need further instruction            PT Short Term Goals - 04/20/20 1058      PT SHORT TERM GOAL #1   Title Pt will be Ind in an initial HEP    Status New    Target Date 05/11/20      PT SHORT TERM GOAL #2   Title Pt will voice understanding of measures to assist in the reduction of migrane HAs    Status New    Target Date 05/11/20             PT Long Term Goals - 04/20/20 1338      PT LONG TERM GOAL #1   Title Pt will be able to self correct posture    Status New    Target Date 06/10/20      PT LONG TERM GOAL #2   Title Pt will report a 50% reduction in her migrane HA symptoms re: intensity and or frequency    Status New    Target Date 06/10/20      PT LONG TERM GOAL #3   Title Pt's FOTO score will improve to the predicted value of 66% ability    Baseline 52% ability    Status New    Target Date 06/10/20      PT LONG TERM GOAL #4   Title Pt will be Ind in a final HEP to maintain or progress pt's achieved improvement in pain and/or funtion    Status New    Target Date 06/10/20                  Plan - 04/20/20 1047    Clinical Impression Statement Presents with a 25 year Hx of migranes on a daily basis. Pt responded well to cervical traction and suboccipital release reporting a decrease in her migrane HA.  Pt was provided with a HEP to address posture, education re: proper computer station set up, and taped tennis balls for suboccipital massage. Pt will benefit from  additional PT to address pain, posture, and muscle imbalances related to her posture to decrease her migrane HA symptoms and improve her quality of life.    Personal Factors and Comorbidities Time since onset of injury/illness/exacerbation;Comorbidity 1    Comorbidities premature CAD    Stability/Clinical Decision Making Evolving/Moderate complexity    Clinical Decision Making Moderate    Rehab Potential Fair    PT Frequency 2x / week    PT Duration 6 weeks    PT Treatment/Interventions Cryotherapy;Electrical Stimulation;Iontophoresis 2m/ml Dexamethasone;Moist Heat;Traction;Ultrasound;Therapeutic exercise;Patient/family education;Dry needling;Passive range of motion;Taping    PT Next Visit Plan Assess response to HEP and use of taped tennis balls for suboccipital massage    PT Home Exercise Plan [id number]    Consulted and Agree with Plan of Care Patient           Patient will benefit from skilled therapeutic intervention in order to improve the following deficits and impairments:  Pain,Postural dysfunction  Visit Diagnosis: Chronic migraine without aura without status migrainosus, not intractable  Chronic left shoulder pain     Problem List Patient Active Problem List   Diagnosis Date Noted  . 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 Recurrent vaginitis 02/02/2016  . Graves disease 03/24/2015  . Multiple thyroid nodules 10/28/2014  . Chronic daily headache 07/15/2013  . FH: premature coronary heart disease 04/23/2012  . Insomnia 12/18/2010  . Migraine 10/25/2010    AGar PontoMS, PT 04/20/20 1:45 PM   PHYSICAL THERAPY/DISCHARGE SUMMARY  Visits from Start of Care: 1  Current functional level related to goals / functional outcomes: Unknown-Pt self DCed   Remaining deficits: Unknown-Pt self Dced   Education / Equipment: HEP  Plan: Patient agrees to discharge.  Patient goals were not met. Patient is being discharged due to financial reasons.  ?????     Pt self  DCed PT after the evaluation as above  AGar PontoMS, PT 05/23/20 7:27 AM   [facility] [address]Phone: [phone]  Fax:  [phone] Name: Brandy STROHMMRN: [id number] of Birth: [birth year]

## 2020-05-06 ENCOUNTER — Other Ambulatory Visit: Payer: BC Managed Care – PPO

## 2020-05-16 ENCOUNTER — Ambulatory Visit (INDEPENDENT_AMBULATORY_CARE_PROVIDER_SITE_OTHER): Payer: BC Managed Care – PPO | Admitting: Neurology

## 2020-05-16 ENCOUNTER — Other Ambulatory Visit: Payer: Self-pay

## 2020-05-16 DIAGNOSIS — G8929 Other chronic pain: Secondary | ICD-10-CM

## 2020-05-16 DIAGNOSIS — M79602 Pain in left arm: Secondary | ICD-10-CM

## 2020-05-16 DIAGNOSIS — G43709 Chronic migraine without aura, not intractable, without status migrainosus: Secondary | ICD-10-CM | POA: Diagnosis not present

## 2020-05-16 DIAGNOSIS — M25512 Pain in left shoulder: Secondary | ICD-10-CM

## 2020-05-16 NOTE — Procedures (Signed)
[facility] Neurology  [address], [address]  [place], [place] [address] Tel: [phone] Fax:  [phone] Test Date:  05/16/2020  Patient: Brandy Cole DOB: 1975/10/19 Physician: Nita Sickle, DO  Sex: Female Height: 5\' 4"  Ref Phys: , DO  ID#: Nita Sickle   Technician:    Patient Complaints: This is a 45 year old female referred for evaluation of left arm muscle spasms.  NCV & EMG Findings: Extensive electrodiagnostic testing of the left upper extremity shows:  1. Left median and ulnar sensory responses are within normal limits. 2. Left median and ulnar motor responses are within normal limits 3. There is no evidence of active or chronic motor axonal loss changes affecting any of the tested muscles.  Motor unit configuration and recruitment pattern is within normal limits.  Impression: This is a normal study of the left upper extremity.  In particular, there is no evidence of myopathy or cervical radiculopathy.   ___________________________ 47, DO    Nerve Conduction Studies Anti Sensory Summary Table   Stim Site NR Peak (ms) Norm Peak (ms) P-T Amp (V) Norm P-T Amp  Left Median Anti Sensory (2nd Digit)  34C  Wrist    2.8 <3.4 92.7 >20  Left Ulnar Anti Sensory (5th Digit)  34C  Wrist    3.1 <3.1 66.2 >12   Motor Summary Table   Stim Site NR Onset (ms) Norm Onset (ms) O-P Amp (mV) Norm O-P Amp Site1 Site2 Delta-0 (ms) Dist (cm) Vel (m/s) Norm Vel (m/s)  Left Median Motor (Abd Poll Brev)  34C  Wrist    2.7 <3.9 13.7 >6 Elbow Wrist 5.0 31.0 62 >50  Elbow    7.7  13.5         Left Ulnar Motor (Abd Dig Minimi)  34C  Wrist    1.9 <3.1 12.5 >7 B Elbow Wrist 3.7 23.0 62 >50  B Elbow    5.6  11.0  A Elbow B Elbow 1.7 10.0 59 >50  A Elbow    7.3  10.6          EMG   Side Muscle Ins Act Fibs Psw Fasc Number Recrt Dur Dur. Amp Amp. Poly Poly. Comment  Left 1stDorInt Nml Nml Nml Nml Nml Nml Nml Nml Nml Nml Nml Nml N/A  Left PronatorTeres Nml  Nml Nml Nml Nml Nml Nml Nml Nml Nml Nml Nml N/A  Left Biceps Nml Nml Nml Nml Nml Nml Nml Nml Nml Nml Nml Nml N/A  Left Triceps Nml Nml Nml Nml Nml Nml Nml Nml Nml Nml Nml Nml N/A  Left Deltoid Nml Nml Nml Nml Nml Nml Nml Nml Nml Nml Nml Nml N/A      Waveforms:

## 2020-05-18 ENCOUNTER — Ambulatory Visit: Payer: Self-pay

## 2020-05-25 ENCOUNTER — Ambulatory Visit: Payer: Self-pay

## 2020-12-28 ENCOUNTER — Telehealth: Payer: Self-pay

## 2020-12-28 NOTE — Telephone Encounter (Signed)
Patient is scheduled   Nurse Assessment Nurse: Stefano Gaul, RN, Dwana Curd Date/Time Lamount Cohen Time): 12/28/2020 8:20:16 AM Confirm and document reason for call. If symptomatic, describe symptoms. ---Caller states her BP is elevated. BP 190/does not remember bottom number on Tuesday. she is SOB at times with a headache. Does the patient have any new or worsening symptoms? ---Yes Will a triage be completed? ---Yes Related visit to physician within the last 2 weeks? ---No Does the PT have any chronic conditions? (i.e. diabetes, asthma, this includes High risk factors for pregnancy, etc.) ---Yes List chronic conditions. ---thyroid disease Is the patient pregnant or possibly pregnant? (Ask all females between the ages of 43-55) ---No Is this a behavioral health or substance abuse call? ---No Guidelines Guideline Title Affirmed Question Affirmed Notes Nurse Date/Time (Eastern Time) Blood Pressure - High [1] Systolic BP >= 160 OR Diastolic >= 100 AND [2] cardiac or neurologic symptoms (e.g., Stefano Gaul, RN, Dwana Curd 12/28/2020 8:21:54 AM PLEASE NOTE: All timestamps contained within this report are represented as [place] Standard Time. CONFIDENTIALTY NOTICE: This fax transmission is intended only for the addressee. It contains information that is legally privileged, confidential or otherwise protected from use or disclosure. If you are not the intended recipient, you are strictly prohibited from reviewing, disclosing, copying using or disseminating any of this information or taking any action in reliance on or regarding this information. If you have received this fax in error, please notify us immediately by telephone so that we can arrange for its return to [place]. Phone: [phone], Toll-Free: [phone], Fax: [phone] Page: 2 of 2 Call Id: [id number] Guidelines Guideline Title Affirmed Question Affirmed Notes Nurse Date/Time Lamount Cohen Time) chest pain, difficulty breathing, unsteady gait,  blurred vision) Disp. Time Lamount Cohen Time) Disposition Final User 12/28/2020 8:17:19 AM Send to Urgent Mammie Russian 12/28/2020 8:25:46 AM Go to ED Now Yes Stefano Gaul, RN, Clerance Lav Disagree/Comply Disagree Caller Understands Yes PreDisposition Call Doctor Care Advice Given Per Guideline GO TO ED NOW: * You need to be seen in the Emergency Department. 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 Triage outcome go to ER (or PCP triage). States she will speak to PCP and call pt back Referrals GO TO FACILITY REFUSED

## 2020-12-29 ENCOUNTER — Encounter: Payer: Self-pay | Admitting: Physician Assistant

## 2020-12-29 ENCOUNTER — Ambulatory Visit: Payer: BC Managed Care – PPO | Admitting: Physician Assistant

## 2020-12-29 ENCOUNTER — Other Ambulatory Visit: Payer: Self-pay

## 2020-12-29 VITALS — BP 133/83 | HR 89 | Temp 98.3°F | Ht 64.0 in | Wt 184.5 lb

## 2020-12-29 DIAGNOSIS — R030 Elevated blood-pressure reading, without diagnosis of hypertension: Secondary | ICD-10-CM | POA: Diagnosis not present

## 2020-12-29 DIAGNOSIS — G43009 Migraine without aura, not intractable, without status migrainosus: Secondary | ICD-10-CM

## 2020-12-29 MED ORDER — PREDNISONE 20 MG PO TABS: 20.0000 mg | ORAL_TABLET | Freq: Two times a day (BID) | ORAL | 0 refills | Status: AC

## 2020-12-29 NOTE — Progress Notes (Signed)
Acute Office Visit  Subjective:    Patient ID: Brandy Cole, female    DOB: 12/14/1975, 45 y.o.   MRN: [id number]  Chief Complaint  Patient presents with   Hypertension    HPI Patient is in today for blood pressure check. Chiropractor appt on Tuesday was 190/ ? (Can't remember the bottom number). She was there for a consult on neck pain and only xrays were done, no adjustments. BP cuff was an automatic wrist cuff. She has been having headaches. She has a hx of migraines and says her head was pounding that day. States normally she can take Excedrin and it goes away, but by mid-day that day she still didn't get relief. She has been taking this daily for awhile now.  She was previously on Trokendi, but this caused memory issues & she had to stop it. She has seen a neurologist previously, says it has been about 25 years of migraines. Does not currently see a specialist.   She is going to try acupuncture and chiropractic relief soon.   Past Medical History:  Diagnosis Date   FHx: premature coronary heart disease    H/O chlamydia infection    Migraine    Oral contraceptive use 10/19/2013   Thyroid disease     Past Surgical History:  Procedure Laterality Date   WISDOM TOOTH EXTRACTION      Family History  Problem Relation Age of Onset   Heart disease Father    Hyperlipidemia Father    Diabetes Father    Hyperlipidemia Mother     Social History   Socioeconomic History   Marital status: Single    Spouse name: Not on file   Number of children: 0   Years of education: 16   Highest education level: Not on file  Occupational History   Occupation: [employer]  Tobacco Use   Smoking status: Never   Smokeless tobacco: Never  Vaping Use   Vaping Use: Never used  Substance and Sexual Activity   Alcohol use: No   Drug use: No   Sexual activity: Yes    Birth control/protection: Condom  Other Topics Concern   Not on file  Social History Narrative   Right  handed   Drinks caffeine   Two story   Social Determinants of Health   Financial Resource Strain: Not on file  Food Insecurity: Not on file  Transportation Needs: Not on file  Physical Activity: Not on file  Stress: Not on file  Social Connections: Not on file  Intimate Partner Violence: Not on file    Outpatient Medications Prior to Visit  Medication Sig Dispense Refill   aspirin-acetaminophen-caffeine (EXCEDRIN MIGRAINE) 250-250-65 MG tablet Take by mouth every 6 (six) hours as needed for headache.     Ibuprofen-diphenhydrAMINE Cit (MOTRIN PM) 200-38 MG TABS Take by mouth.     norgestimate-ethinyl estradiol (ORTHO-CYCLEN) 0.25-35 MG-MCG tablet Take by mouth.     nortriptyline (PAMELOR) 10 MG capsule Start nortriptyline 10mg  at bedtime for 2 week, then increase to 2 tablet at bedtime 60 capsule 3   Omega-3 Fatty Acids (FISH OIL) 1000 MG CAPS Fish Oil  1capsule po daily     propylthiouracil (PTU) 50 MG tablet Take 100 mg by mouth 2 (two) times daily.     cyclobenzaprine (FLEXERIL) 10 MG tablet Take 1 tablet (10 mg total) by mouth 3 (three) times daily as needed for muscle spasms. 30 tablet 0   Phentermine HCl (LOMAIRA) 8 MG TABS  Take 8 mg by mouth daily.     Probiotic CAPS Take by mouth.     No facility-administered medications prior to visit.    Allergies  Allergen Reactions   Methimazole Itching   Voltaren  [Diclofenac Sodium] Other (See Comments)   Voltaren  [Diclofenac] Other (See Comments)    Review of Systems REFER TO HPI FOR PERTINENT POSITIVES AND NEGATIVES     Objective:    Physical Exam Vitals and nursing note reviewed.  Constitutional:      Appearance: Normal appearance. She is normal weight. She is not toxic-appearing.  HENT:     Head: Normocephalic and atraumatic.     Right Ear: External ear normal.     Left Ear: External ear normal.     Nose: Nose normal.     Mouth/Throat:     Mouth: Mucous membranes are moist.  Eyes:     Extraocular Movements:  Extraocular movements intact.     Conjunctiva/sclera: Conjunctivae normal.     Pupils: Pupils are equal, round, and reactive to light.  Cardiovascular:     Rate and Rhythm: Normal rate and regular rhythm.     Pulses: Normal pulses.     Heart sounds: Normal heart sounds.  Pulmonary:     Effort: Pulmonary effort is normal.     Breath sounds: Normal breath sounds.  Abdominal:     General: Abdomen is flat. Bowel sounds are normal.     Palpations: Abdomen is soft.  Musculoskeletal:        General: Normal range of motion.     Cervical back: Normal range of motion and neck supple.  Skin:    General: Skin is warm and dry.  Neurological:     General: No focal deficit present.     Mental Status: She is alert and oriented to person, place, and time.     Cranial Nerves: No cranial nerve deficit.     Sensory: No sensory deficit.     Motor: No weakness.     Gait: Gait normal.  Psychiatric:        Mood and Affect: Mood normal.        Behavior: Behavior normal.        Thought Content: Thought content normal.        Judgment: Judgment normal.    BP 133/83   Pulse 89   Temp 98.3 F (36.8 C)   Ht 5\' 4"  (1.626 m)   Wt 184 lb 8 oz (83.7 kg)   SpO2 100%   BMI 31.67 kg/m  Wt Readings from Last 3 Encounters:  12/29/20 184 lb 8 oz (83.7 kg)  03/30/20 179 lb (81.2 kg)  03/10/20 176 lb 9.6 oz (80.1 kg)    Health Maintenance Due  Topic Date Due   Pneumococcal Vaccine 63-34 Years old (1 - PCV) Never done   Hepatitis C Screening  Never done   COLONOSCOPY (Pts 45-32yrs Insurance coverage will need to be confirmed)  Never done   COVID-19 Vaccine (3 - Pfizer risk series) 09/29/2019   MAMMOGRAM  04/22/2020   INFLUENZA VACCINE  Never done    There are no preventive care reminders to display for this patient.   Lab Results  Component Value Date   TSH 0.76 03/10/2020   Lab Results  Component Value Date   WBC 5.4 03/10/2020   HGB 12.5 03/10/2020   HCT 37.1 03/10/2020   MCV 90.7  03/10/2020   PLT 404 (H) 03/10/2020   Lab Results  Component Value Date   NA 138 03/10/2020   K 4.7 03/10/2020   CO2 27 03/10/2020   GLUCOSE 86 03/10/2020   BUN 11 03/10/2020   CREATININE 0.91 03/10/2020   BILITOT 0.5 03/10/2020   ALKPHOS 59 03/08/2019   AST 12 03/10/2020   ALT 11 03/10/2020   PROT 7.2 03/10/2020   ALBUMIN 4.3 03/08/2019   CALCIUM 9.6 03/10/2020   ANIONGAP 12 06/22/2017   GFR 87.53 03/08/2019   Lab Results  Component Value Date   CHOL 225 (H) 03/08/2019   Lab Results  Component Value Date   HDL 44.00 03/08/2019   Lab Results  Component Value Date   LDLCALC 164 (H) 03/08/2019   Lab Results  Component Value Date   TRIG 87.0 03/08/2019   Lab Results  Component Value Date   CHOLHDL 5 03/08/2019   Lab Results  Component Value Date   HGBA1C 5.9 03/08/2019       Assessment & Plan:   Problem List Items Addressed This Visit   None   1. Migraine without aura and without status migrainosus, not intractable -Chronic, worsening again -Previously failed Trokendi / Topamax due to memory loss -Daily Excedrin and Motrin currently may be causing rebound headaches -Discussed using prednisone to help bridge off the NSAIDs, Rx sent today -She also wants to try Acupuncture -Keep HA diary -Consider once monthly injectables if still having issues  2. Elevated blood pressure reading without diagnosis of hypertension -? 2/2 migraines -Need to monitor closely -She is going to buy a device and bring log to next appt -Low salt diet / exercise advised   Total time spent with patient in face to face encounter including H & P, plan & documentation was 32 minutes.   Parilee Hally M Karema Tocci, PA-C

## 2020-12-29 NOTE — Patient Instructions (Addendum)
Good to meet you today! I think your chronic migraines might be driving your blood pressure readings up.  We need to get you off the daily Excedrin / Motrin. You will need to stop these medications & you can use the prednisone as directed as a bridge to help buffer off these.  Try acupuncture. Buy a blood pressure cuff. Monitor your headaches and blood pressure - bring log to next appointment.  Call sooner if any concerns.

## 2021-01-26 ENCOUNTER — Encounter: Payer: Self-pay | Admitting: Physician Assistant

## 2021-01-26 ENCOUNTER — Other Ambulatory Visit: Payer: Self-pay

## 2021-01-26 ENCOUNTER — Ambulatory Visit: Payer: BC Managed Care – PPO | Admitting: Physician Assistant

## 2021-01-26 VITALS — BP 122/81 | HR 85 | Temp 98.2°F | Ht 64.0 in | Wt 187.2 lb

## 2021-01-26 DIAGNOSIS — G43009 Migraine without aura, not intractable, without status migrainosus: Secondary | ICD-10-CM | POA: Diagnosis not present

## 2021-01-26 DIAGNOSIS — R030 Elevated blood-pressure reading, without diagnosis of hypertension: Secondary | ICD-10-CM | POA: Diagnosis not present

## 2021-01-26 NOTE — Patient Instructions (Signed)
Please continue to monitor your blood pressure at home.  Call if any problems.  Schedule annual physical with Dr. Mardelle Matte.

## 2021-01-26 NOTE — Progress Notes (Signed)
Subjective:    Patient ID: Brandy Cole, female    DOB: [birth year], 45 y.o.   MRN: [id number]  Chief Complaint  Patient presents with   Follow-up     HPI Patient is in today for follow-up on blood pressure and migraines.  She states that she is still taking Excedrin every day and she would like to get off of that, but has not tried the prednisone course that I prescribed and that we discussed last visit.  She states that with work right now she does not feel like it is a good time to try to stop the Excedrin.  She does feel like her headaches are not as bad as they were at the last visit.  Occasionally she will have blood pressure readings in the 150s over 90s at home, but says she is not sure if this is due to pain or user error.  She does not take any blood pressure medication.  She does not have any other symptoms or concerns today.   Past Medical History:  Diagnosis Date   FHx: premature coronary heart disease    H/O chlamydia infection    Migraine    Oral contraceptive use 10/19/2013   Thyroid disease     Past Surgical History:  Procedure Laterality Date   WISDOM TOOTH EXTRACTION      Family History  Problem Relation Age of Onset   Heart disease Father    Hyperlipidemia Father    Diabetes Father    Hyperlipidemia Mother     Social History   Tobacco Use   Smoking status: Never   Smokeless tobacco: Never  Vaping Use   Vaping Use: Never used  Substance Use Topics   Alcohol use: No   Drug use: No     Allergies  Allergen Reactions   Methimazole Itching   Voltaren  [Diclofenac Sodium] Other (See Comments)   Voltaren  [Diclofenac] Other (See Comments)    Review of Systems REFER TO HPI FOR PERTINENT POSITIVES AND NEGATIVES      Objective:     BP 122/81   Pulse 85   Temp 98.2 F (36.8 C)   Ht 5\' 4"  (1.626 m)   Wt 187 lb 3.2 oz (84.9 kg)   SpO2 100%   BMI 32.13 kg/m   Wt Readings from Last 3 Encounters:  01/26/21 187 lb 3.2 oz (84.9 kg)   12/29/20 184 lb 8 oz (83.7 kg)  03/30/20 179 lb (81.2 kg)    BP Readings from Last 3 Encounters:  01/26/21 122/81  12/29/20 133/83  03/30/20 (!) 166/97     Physical Exam Vitals and nursing note reviewed.  Constitutional:      Appearance: Normal appearance. She is normal weight. She is not toxic-appearing.  HENT:     Head: Normocephalic and atraumatic.     Right Ear: External ear normal.     Left Ear: External ear normal.     Nose: Nose normal.     Mouth/Throat:     Mouth: Mucous membranes are moist.  Eyes:     Extraocular Movements: Extraocular movements intact.     Conjunctiva/sclera: Conjunctivae normal.     Pupils: Pupils are equal, round, and reactive to light.  Cardiovascular:     Rate and Rhythm: Normal rate and regular rhythm.     Pulses: Normal pulses.     Heart sounds: Normal heart sounds.  Pulmonary:     Effort: Pulmonary effort is normal.     Breath  sounds: Normal breath sounds.  Abdominal:     General: Abdomen is flat. Bowel sounds are normal.     Palpations: Abdomen is soft.  Musculoskeletal:        General: Normal range of motion.     Cervical back: Normal range of motion and neck supple.  Skin:    General: Skin is warm and dry.  Neurological:     General: No focal deficit present.     Mental Status: She is alert and oriented to person, place, and time.     Cranial Nerves: No cranial nerve deficit.     Sensory: No sensory deficit.     Motor: No weakness.     Gait: Gait normal.  Psychiatric:        Mood and Affect: Mood normal.        Behavior: Behavior normal.        Thought Content: Thought content normal.        Judgment: Judgment normal.       Assessment & Plan:   Problem List Items Addressed This Visit       Cardiovascular and Mediastinum   Migraine   Other Visit Diagnoses     Elevated blood pressure reading without diagnosis of hypertension    -  Primary       1. Elevated blood pressure reading without diagnosis of  hypertension Blood pressure reading is normal again today in office.  I personally rechecked after my CMA and had the same reading.  Encourage patient to continue monitoring at home.  She will limit salt and continue to work on diet and exercise.  2. Migraine without aura and without status migrainosus, not intractable Encouraged her again today to try to get off the Excedrin every day.  She has the prednisone at home to try to use as a bridge and she says she wants to do this when work slows down.  Right now she is still seeing a chiropractor and she is wanting to get an acupuncture for headaches as well.  She will discuss with Dr. Mardelle Matte at her next physical, which she hopes to schedule later this year.    This note was prepared with assistance of Conservation officer, historic buildings. Occasional wrong-word or sound-a-like substitutions may have occurred due to the inherent limitations of voice recognition software.  Time Spent: 21 minutes of total time was spent on the date of the encounter performing the following actions: chart review prior to seeing the patient, obtaining history, performing a medically necessary exam, counseling on the treatment plan, placing orders, and documenting in our EHR.    Eman Morimoto M Lavonia Eager, PA-C

## 2021-04-03 ENCOUNTER — Encounter: Payer: Self-pay | Admitting: Family Medicine

## 2021-04-03 ENCOUNTER — Other Ambulatory Visit: Payer: Self-pay

## 2021-04-03 ENCOUNTER — Ambulatory Visit (INDEPENDENT_AMBULATORY_CARE_PROVIDER_SITE_OTHER): Payer: BC Managed Care – PPO | Admitting: Family Medicine

## 2021-04-03 VITALS — BP 126/80 | HR 75 | Temp 97.5°F | Ht 64.0 in | Wt 189.6 lb

## 2021-04-03 DIAGNOSIS — E0500 Thyrotoxicosis with diffuse goiter without thyrotoxic crisis or storm: Secondary | ICD-10-CM

## 2021-04-03 DIAGNOSIS — G43009 Migraine without aura, not intractable, without status migrainosus: Secondary | ICD-10-CM

## 2021-04-03 DIAGNOSIS — E042 Nontoxic multinodular goiter: Secondary | ICD-10-CM | POA: Diagnosis not present

## 2021-04-03 DIAGNOSIS — Z3041 Encounter for surveillance of contraceptive pills: Secondary | ICD-10-CM

## 2021-04-03 DIAGNOSIS — R519 Headache, unspecified: Secondary | ICD-10-CM

## 2021-04-03 DIAGNOSIS — Z8249 Family history of ischemic heart disease and other diseases of the circulatory system: Secondary | ICD-10-CM | POA: Diagnosis not present

## 2021-04-03 DIAGNOSIS — Z0000 Encounter for general adult medical examination without abnormal findings: Secondary | ICD-10-CM

## 2021-04-03 DIAGNOSIS — E669 Obesity, unspecified: Secondary | ICD-10-CM | POA: Insufficient documentation

## 2021-04-03 LAB — COMPREHENSIVE METABOLIC PANEL
ALT: 18 U/L (ref 0–35)
AST: 18 U/L (ref 0–37)
Albumin: 3.8 g/dL (ref 3.5–5.2)
Alkaline Phosphatase: 48 U/L (ref 39–117)
BUN: 10 mg/dL (ref 6–23)
CO2: 29 mEq/L (ref 19–32)
Calcium: 9.4 mg/dL (ref 8.4–10.5)
Chloride: 103 mEq/L (ref 96–112)
Creatinine, Ser: 0.85 mg/dL (ref 0.40–1.20)
GFR: 81.81 mL/min (ref >60.00)
Glucose, Bld: 98 mg/dL (ref 70–99)
Potassium: 4.4 mEq/L (ref 3.5–5.1)
Sodium: 140 mEq/L (ref 135–145)
Total Bilirubin: 0.6 mg/dL (ref 0.2–1.2)
Total Protein: 7.1 g/dL (ref 6.0–8.3)

## 2021-04-03 LAB — LIPID PANEL
Cholesterol: 232 mg/dL — ABNORMAL HIGH (ref 0–200)
HDL: 62.1 mg/dL (ref >39.00)
LDL Cholesterol: 153 mg/dL — ABNORMAL HIGH (ref 0–99)
NonHDL: 169.83
Total CHOL/HDL Ratio: 4
Triglycerides: 82 mg/dL (ref 0.0–149.0)
VLDL: 16.4 mg/dL (ref 0.0–40.0)

## 2021-04-03 LAB — CBC WITH DIFFERENTIAL/PLATELET
Basophils Absolute: 0 10*3/uL (ref 0.0–0.1)
Basophils Relative: 0.9 % (ref 0.0–3.0)
Eosinophils Absolute: 0.2 10*3/uL (ref 0.0–0.7)
Eosinophils Relative: 4 % (ref 0.0–5.0)
HCT: 37.1 % (ref 36.0–46.0)
Hemoglobin: 12.2 g/dL (ref 12.0–15.0)
Lymphocytes Relative: 24.9 % (ref 12.0–46.0)
Lymphs Abs: 1.3 10*3/uL (ref 0.7–4.0)
MCHC: 32.8 g/dL (ref 30.0–36.0)
MCV: 92.1 fl (ref 78.0–100.0)
Monocytes Absolute: 0.4 10*3/uL (ref 0.1–1.0)
Monocytes Relative: 6.9 % (ref 3.0–12.0)
Neutro Abs: 3.4 10*3/uL (ref 1.4–7.7)
Neutrophils Relative %: 63.3 % (ref 43.0–77.0)
Platelets: 474 10*3/uL — ABNORMAL HIGH (ref 150.0–400.0)
RBC: 4.03 Mil/uL (ref 3.87–5.11)
RDW: 14.4 % (ref 11.5–15.5)
WBC: 5.3 10*3/uL (ref 4.0–10.5)

## 2021-04-03 LAB — TSH: TSH: 1.14 u[IU]/mL (ref 0.35–5.50)

## 2021-04-03 LAB — HEMOGLOBIN A1C: Hgb A1c MFr Bld: 6 % (ref 4.6–6.5)

## 2021-04-03 MED ORDER — NORGESTIMATE-ETH ESTRADIOL 0.25-35 MG-MCG PO TABS: 1.0000 | ORAL_TABLET | Freq: Every day | ORAL | 3 refills | Status: DC

## 2021-04-03 MED ORDER — PHENTERMINE HCL 37.5 MG PO TABS: 37.5000 mg | ORAL_TABLET | Freq: Every day | ORAL | 5 refills | Status: DC

## 2021-04-03 NOTE — Progress Notes (Signed)
Subjective  Chief Complaint  Patient presents with   Annual Exam    Fasting   Migraine    Have slightly improved    HPI: Brandy Cole is a 45 y.o. female who presents to [facility] today for a Female Wellness Visit. She also has the concerns and/or needs as listed above in the chief complaint. These will be addressed in addition to the Health Maintenance Visit.   Wellness Visit: annual visit with health maintenance review and exam without Pap  HM: 45 year old female with screens that are current and up-to-date.  Immunizations are up-to-date.  No new complaints.  Overall stable.  Uses oral contraceptives, needs refill.  Has GYN exam next month.  No adverse effects  Chronic disease f/u and/or acute problem visit: (deemed necessary to be done in addition to the wellness visit): Graves' disease: Reviewed endocrinology notes.  Clinically euthyroid by most recent lab testing.  Continues on PTU twice daily.  No symptoms of hyperthyroidism. Migraine chronic daily headaches and likely rebound headaches: Reports they are doing slightly better.  Was taking Excedrin daily but now down to to 3-4 times per week.  She is not taking abortives and not taking preventatives.  She has failed Topamax due to brain fog.  Has never tried the newer preventatives.  She understands the likelihood of chronic daily medicines exacerbating her conditions.  She is aware of triggers.  Currently seeing a chiropractor who is helping her with muscle tension that do contribute to her headaches.  No red flag symptoms present Multiple thyroid nodules: No obstructive symptoms.  Followed by endocrinology. Obesity: Continues to struggle with weight loss.  Often feels hungry.  comfort eating can also be a problem.  Inquires about GLP-1 agonist.  Has been on phentermine in the past successfully although it was stopped because of could have been related to her irregular muscle movements, however she feels this  was more stress related. Refilled birth control.  Assessment  1. Annual physical exam   2. Graves disease   3. Chronic daily headache   4. Migraine without aura and without status migrainosus, not intractable   5. FH: premature coronary heart disease   6. Multiple thyroid nodules   7. Obesity (BMI 30-39.9)   8. Oral contraceptive use      Plan  Female Wellness Visit: Age appropriate Health Maintenance and Prevention measures were discussed with patient. Included topics are cancer screening recommendations, ways to keep healthy (see AVS) including dietary and exercise recommendations, regular eye and dental care, use of seat belts, and avoidance of moderate alcohol use and tobacco use.  BMI: discussed patient's BMI and encouraged positive lifestyle modifications to help get to or maintain a target BMI. HM needs and immunizations were addressed and ordered. See below for orders. See HM and immunization section for updates.  Up-to-date Routine labs and screening tests ordered including cmp, cbc and lipids where appropriate. Discussed recommendations regarding Vit D and calcium supplementation (see AVS)  Chronic disease management visit and/or acute problem visit: Graves' disease and thyroid nodules: Recheck thyroid.  Clinically euthyroid.  Continue PTU 100 twice daily Headaches: Had long discussions about realistic expectations and the need to get better control and break the cycle of rebound headaches.  Patient understands.  Recommend referral back to neurology to discuss other preventatives and abortive therapies.  Patient will schedule. Obesity: Diet education given.  Discussed GLP-1 agonist but patient defers currently.  Will retry phentermine. Continue OCPs daily.  Follow up: Return in about 1 year (around 04/03/2022) for complete physical.  Orders Placed This Encounter  Procedures   CBC with Differential/Platelet   Comprehensive metabolic panel   Lipid panel   TSH   Hepatitis C  antibody   Hemoglobin A1c   Meds ordered this encounter  Medications   norgestimate-ethinyl estradiol (ORTHO-CYCLEN) 0.25-35 MG-MCG tablet    Sig: Take 1 tablet by mouth daily.    Dispense:  90 tablet    Refill:  3   phentermine (ADIPEX-P) 37.5 MG tablet    Sig: Take 1 tablet (37.5 mg total) by mouth daily before breakfast.    Dispense:  30 tablet    Refill:  5      Body mass index is 32.54 kg/m. 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 No apnea    Migraine 10/25/2010    Overview:  Neurology - in [facility]; nl CT scan in past.    Health Maintenance  Topic Date Due   Hepatitis C Screening  Never done   MAMMOGRAM  05/06/2021   PAP SMEAR-Modifier  05/21/2022   TETANUS/TDAP  10/20/2023   COLONOSCOPY (Pts 45-26yrs Insurance coverage will need to be confirmed)  09/09/2030   COVID-19 Vaccine  Completed   HIV Screening  Completed   HPV VACCINES  Aged Out   Pneumococcal Vaccine 23-38 Years old  Discontinued   INFLUENZA VACCINE  Discontinued   Immunization History  Administered Date(s) Administered   Hepatitis A, Ped/Adol-2 Dose 04/29/2014   Moderna Covid-19 Vaccine Bivalent Booster 54yrs & up 03/04/2021   PFIZER(Purple Top)SARS-COV-2 Vaccination 08/11/2019, 09/01/2019, 04/06/2020   Tdap 10/19/2013   We updated and reviewed the patient's past history in detail and it is documented below. Allergies: Patient is allergic to methimazole, voltaren  [diclofenac sodium], and voltaren   [diclofenac]. Past Medical History Patient  has a past medical history of FHx: premature coronary heart disease, H/O chlamydia infection, Migraine, Oral contraceptive use (10/19/2013), and Thyroid disease. Past Surgical History Patient  has a past surgical history that includes Wisdom tooth extraction. Family History: Patient family history includes Diabetes in her father; Heart disease in her father; Hyperlipidemia in her father and mother. Social History:  Patient  reports that she has never smoked. She has never used smokeless tobacco. She reports that she does not drink alcohol and does not use drugs.  Review of Systems: Constitutional: negative for fever or malaise Ophthalmic: negative for photophobia, double vision or loss of vision Cardiovascular: negative for chest pain, dyspnea on exertion, or new LE swelling Respiratory: negative for SOB or persistent cough Gastrointestinal: negative for abdominal pain, change in bowel habits or melena Genitourinary: negative for dysuria or gross hematuria, no abnormal uterine bleeding or disharge Musculoskeletal: negative for new gait disturbance or muscular weakness Integumentary: negative for new or persistent rashes, no breast lumps Neurological: negative for TIA or stroke symptoms Psychiatric: negative for SI or delusions Allergic/Immunologic: negative for hives  Patient Care  Team    Relationship Specialty Notifications Start End  Willow Ora, MD PCP - General Family Medicine  10/31/17   Hal Morales, MD (Inactive) Consulting Physician Obstetrics and Gynecology  11/13/17   Berdine Dance, NP Nurse Practitioner Bariatrics  11/13/17   Daiva Nakayama, MD Referring Physician Internal Medicine  11/13/17   Gelene Mink, OD Referring Physician Optometry  03/06/18   Glendale Chard, DO Consulting Physician Neurology  04/03/21    Comment: migraines and abnl UE mvts,    Objective  Vitals: BP 126/80    Pulse 75    Temp (!) 97.5 F (36.4  C) (Temporal)    Ht 5\' 4"  (1.626 m)    Wt 189 lb 9.6 oz (86 kg)    LMP 03/23/2021 (Exact Date)    SpO2 99%    BMI 32.54 kg/m  General:  Well developed, well nourished, no acute distress  Psych:  Alert and orientedx3,normal mood and affect HEENT:  Normocephalic, atraumatic, non-icteric sclera,  supple neck without adenopathy, mass, stable thyromegaly, nontender Cardiovascular:  Normal S1, S2, RRR without gallop, rub or murmur Respiratory:  Good breath sounds bilaterally, CTAB with normal respiratory effort Gastrointestinal: normal bowel sounds, soft, non-tender, no noted masses. No HSM MSK: no deformities, contusions. Joints are without erythema or swelling.  Skin:  Warm, no rashes or suspicious lesions noted Neurologic:    Mental status is normal. CN 2-11 are normal. Gross motor and sensory exams are normal. Normal gait. No tremor   Commons side effects, risks, benefits, and alternatives for medications and treatment plan prescribed today were discussed, and the patient expressed understanding of the given instructions. Patient is instructed to call or message via MyChart if he/she has any questions or concerns regarding our treatment plan. No barriers to understanding were identified. We discussed Red Flag symptoms and signs in detail. Patient expressed understanding regarding what to do in case of urgent or emergency type symptoms.  Medication list was reconciled, printed and provided to the patient in AVS. Patient instructions and summary information was reviewed with the patient as documented in the AVS. This note was prepared with assistance of Dragon voice recognition software. Occasional wrong-word or sound-a-like substitutions may have occurred due to the inherent limitations of voice recognition software  This visit occurred during the SARS-CoV-2 public health emergency.  Safety protocols were in place, including screening questions prior to the visit, additional usage of staff PPE, and  extensive cleaning of exam room while observing appropriate contact time as indicated for disinfecting solutions.

## 2021-04-03 NOTE — Patient Instructions (Addendum)
Please return in 12 months for your annual complete physical; please come fasting.   I will release your lab results to you on your MyChart account with further instructions. Please reply with any questions.    Please schedule with Dr. Allena Katz to help manage your migraines. There are new preventives that can be helpful.   If you have any questions or concerns, please don't hesitate to send me a message via MyChart or call the office at [phone]. Thank you for visiting with Brandy Cole today! It's our pleasure caring for you.   Please do these things to maintain good health!  Exercise at least 30-45 minutes a day,  4-5 days a week.  Eat a low-fat diet with lots of fruits and vegetables, up to 7-9 servings per day. Drink plenty of water daily. Try to drink 8 8oz glasses per day. Seatbelts can save your life. Always wear your seatbelt. Place Smoke Detectors on every level of your home and check batteries every year. Schedule an appointment with an eye doctor for an eye exam every 1-2 years Safe sex - use condoms to protect yourself from STDs if you could be exposed to these types of infections. Use birth control if you do not want to become pregnant and are sexually active. Avoid heavy alcohol use. If you drink, keep it to less than 2 drinks/day and not every day. Health Care Power of Attorney.  Choose someone you trust that could speak for you if you became unable to speak for yourself. Depression is common in our stressful world.If you're feeling down or losing interest in things you normally enjoy, please come in for a visit. If anyone is threatening or hurting you, please get help. Physical or Emotional Violence is never OK.

## 2021-04-04 ENCOUNTER — Encounter: Payer: Self-pay | Admitting: Family Medicine

## 2021-04-04 LAB — HEPATITIS C ANTIBODY
Hepatitis C Ab: NONREACTIVE
SIGNAL TO CUT-OFF: 0.05 (ref <1.00)

## 2021-04-06 ENCOUNTER — Ambulatory Visit (HOSPITAL_COMMUNITY)
Admission: RE | Admit: 2021-04-06 | Discharge: 2021-04-06 | Disposition: A | Discharge status: Home / Self Care | Payer: BC Managed Care – PPO | Source: Ambulatory Visit | Attending: Cardiovascular Disease | Admitting: Cardiovascular Disease

## 2021-04-06 ENCOUNTER — Other Ambulatory Visit (HOSPITAL_COMMUNITY): Payer: Self-pay | Admitting: Orthopedic Surgery

## 2021-04-06 ENCOUNTER — Other Ambulatory Visit: Payer: Self-pay

## 2021-04-06 DIAGNOSIS — M79604 Pain in right leg: Secondary | ICD-10-CM

## 2021-05-08 ENCOUNTER — Other Ambulatory Visit: Payer: Self-pay | Admitting: Family Medicine

## 2021-05-08 NOTE — Telephone Encounter (Signed)
Last visit 04/03/2021  Last refill 04/03/2021  Quantity #30  Ref 5

## 2021-08-07 ENCOUNTER — Other Ambulatory Visit: Payer: Self-pay | Admitting: Orthopaedic Surgery

## 2021-08-07 DIAGNOSIS — M25572 Pain in left ankle and joints of left foot: Secondary | ICD-10-CM

## 2021-08-18 ENCOUNTER — Ambulatory Visit
Admission: RE | Admit: 2021-08-18 | Discharge: 2021-08-18 | Disposition: A | Discharge status: Home / Self Care | Payer: Self-pay | Source: Ambulatory Visit | Attending: Orthopaedic Surgery | Admitting: Orthopaedic Surgery

## 2021-08-18 DIAGNOSIS — M25572 Pain in left ankle and joints of left foot: Secondary | ICD-10-CM

## 2021-12-17 DIAGNOSIS — M76822 Posterior tibial tendinitis, left leg: Secondary | ICD-10-CM | POA: Diagnosis not present

## 2021-12-28 DIAGNOSIS — S96912D Strain of unspecified muscle and tendon at ankle and foot level, left foot, subsequent encounter: Secondary | ICD-10-CM | POA: Diagnosis not present

## 2021-12-31 DIAGNOSIS — S96912D Strain of unspecified muscle and tendon at ankle and foot level, left foot, subsequent encounter: Secondary | ICD-10-CM | POA: Diagnosis not present

## 2022-01-03 DIAGNOSIS — S96912D Strain of unspecified muscle and tendon at ankle and foot level, left foot, subsequent encounter: Secondary | ICD-10-CM | POA: Diagnosis not present

## 2022-01-07 DIAGNOSIS — S96912D Strain of unspecified muscle and tendon at ankle and foot level, left foot, subsequent encounter: Secondary | ICD-10-CM | POA: Diagnosis not present

## 2022-01-11 DIAGNOSIS — S96912D Strain of unspecified muscle and tendon at ankle and foot level, left foot, subsequent encounter: Secondary | ICD-10-CM | POA: Diagnosis not present

## 2022-01-14 DIAGNOSIS — S96912D Strain of unspecified muscle and tendon at ankle and foot level, left foot, subsequent encounter: Secondary | ICD-10-CM | POA: Diagnosis not present

## 2022-01-18 DIAGNOSIS — S96912D Strain of unspecified muscle and tendon at ankle and foot level, left foot, subsequent encounter: Secondary | ICD-10-CM | POA: Diagnosis not present

## 2022-01-21 DIAGNOSIS — S96912D Strain of unspecified muscle and tendon at ankle and foot level, left foot, subsequent encounter: Secondary | ICD-10-CM | POA: Diagnosis not present

## 2022-01-28 DIAGNOSIS — M25572 Pain in left ankle and joints of left foot: Secondary | ICD-10-CM | POA: Diagnosis not present

## 2022-01-28 DIAGNOSIS — S96912D Strain of unspecified muscle and tendon at ankle and foot level, left foot, subsequent encounter: Secondary | ICD-10-CM | POA: Diagnosis not present

## 2022-02-01 DIAGNOSIS — S96912D Strain of unspecified muscle and tendon at ankle and foot level, left foot, subsequent encounter: Secondary | ICD-10-CM | POA: Diagnosis not present

## 2022-02-04 DIAGNOSIS — S96912D Strain of unspecified muscle and tendon at ankle and foot level, left foot, subsequent encounter: Secondary | ICD-10-CM | POA: Diagnosis not present

## 2022-02-08 DIAGNOSIS — S96912D Strain of unspecified muscle and tendon at ankle and foot level, left foot, subsequent encounter: Secondary | ICD-10-CM | POA: Diagnosis not present

## 2022-02-11 DIAGNOSIS — S96912D Strain of unspecified muscle and tendon at ankle and foot level, left foot, subsequent encounter: Secondary | ICD-10-CM | POA: Diagnosis not present

## 2022-02-15 DIAGNOSIS — S96912D Strain of unspecified muscle and tendon at ankle and foot level, left foot, subsequent encounter: Secondary | ICD-10-CM | POA: Diagnosis not present

## 2022-02-18 DIAGNOSIS — S96912D Strain of unspecified muscle and tendon at ankle and foot level, left foot, subsequent encounter: Secondary | ICD-10-CM | POA: Diagnosis not present

## 2022-02-20 DIAGNOSIS — S96912D Strain of unspecified muscle and tendon at ankle and foot level, left foot, subsequent encounter: Secondary | ICD-10-CM | POA: Diagnosis not present

## 2022-02-25 DIAGNOSIS — S96912D Strain of unspecified muscle and tendon at ankle and foot level, left foot, subsequent encounter: Secondary | ICD-10-CM | POA: Diagnosis not present

## 2022-03-01 DIAGNOSIS — S96912D Strain of unspecified muscle and tendon at ankle and foot level, left foot, subsequent encounter: Secondary | ICD-10-CM | POA: Diagnosis not present

## 2022-03-04 DIAGNOSIS — S96912D Strain of unspecified muscle and tendon at ankle and foot level, left foot, subsequent encounter: Secondary | ICD-10-CM | POA: Diagnosis not present

## 2022-03-06 DIAGNOSIS — S96912D Strain of unspecified muscle and tendon at ankle and foot level, left foot, subsequent encounter: Secondary | ICD-10-CM | POA: Diagnosis not present

## 2022-03-11 DIAGNOSIS — S96912D Strain of unspecified muscle and tendon at ankle and foot level, left foot, subsequent encounter: Secondary | ICD-10-CM | POA: Diagnosis not present

## 2022-03-13 DIAGNOSIS — S96912D Strain of unspecified muscle and tendon at ankle and foot level, left foot, subsequent encounter: Secondary | ICD-10-CM | POA: Diagnosis not present

## 2022-03-20 DIAGNOSIS — S96912D Strain of unspecified muscle and tendon at ankle and foot level, left foot, subsequent encounter: Secondary | ICD-10-CM | POA: Diagnosis not present

## 2022-03-25 DIAGNOSIS — S96912D Strain of unspecified muscle and tendon at ankle and foot level, left foot, subsequent encounter: Secondary | ICD-10-CM | POA: Diagnosis not present

## 2022-04-01 DIAGNOSIS — S96912D Strain of unspecified muscle and tendon at ankle and foot level, left foot, subsequent encounter: Secondary | ICD-10-CM | POA: Diagnosis not present

## 2022-04-04 DIAGNOSIS — S96912D Strain of unspecified muscle and tendon at ankle and foot level, left foot, subsequent encounter: Secondary | ICD-10-CM | POA: Diagnosis not present

## 2022-04-05 ENCOUNTER — Encounter: Payer: Self-pay | Admitting: Family Medicine

## 2022-04-05 ENCOUNTER — Ambulatory Visit (INDEPENDENT_AMBULATORY_CARE_PROVIDER_SITE_OTHER): Payer: BC Managed Care – PPO | Admitting: Family Medicine

## 2022-04-05 VITALS — BP 128/68 | HR 85 | Temp 97.7°F | Ht 64.0 in | Wt 193.2 lb

## 2022-04-05 DIAGNOSIS — R7303 Prediabetes: Secondary | ICD-10-CM

## 2022-04-05 DIAGNOSIS — E669 Obesity, unspecified: Secondary | ICD-10-CM

## 2022-04-05 DIAGNOSIS — E0500 Thyrotoxicosis with diffuse goiter without thyrotoxic crisis or storm: Secondary | ICD-10-CM

## 2022-04-05 DIAGNOSIS — Z0000 Encounter for general adult medical examination without abnormal findings: Secondary | ICD-10-CM | POA: Diagnosis not present

## 2022-04-05 DIAGNOSIS — Z13220 Encounter for screening for lipoid disorders: Secondary | ICD-10-CM

## 2022-04-05 DIAGNOSIS — G43009 Migraine without aura, not intractable, without status migrainosus: Secondary | ICD-10-CM

## 2022-04-05 DIAGNOSIS — Z3041 Encounter for surveillance of contraceptive pills: Secondary | ICD-10-CM

## 2022-04-05 LAB — CBC WITH DIFFERENTIAL/PLATELET
Basophils Absolute: 0.1 10*3/uL (ref 0.0–0.1)
Basophils Relative: 0.9 % (ref 0.0–3.0)
Eosinophils Absolute: 0.1 10*3/uL (ref 0.0–0.7)
Eosinophils Relative: 1.6 % (ref 0.0–5.0)
HCT: 36.7 % (ref 36.0–46.0)
Hemoglobin: 12.2 g/dL (ref 12.0–15.0)
Lymphocytes Relative: 23.8 % (ref 12.0–46.0)
Lymphs Abs: 1.7 10*3/uL (ref 0.7–4.0)
MCHC: 33.3 g/dL (ref 30.0–36.0)
MCV: 90.5 fl (ref 78.0–100.0)
Monocytes Absolute: 0.5 10*3/uL (ref 0.1–1.0)
Monocytes Relative: 7.3 % (ref 3.0–12.0)
Neutro Abs: 4.7 10*3/uL (ref 1.4–7.7)
Neutrophils Relative %: 66.4 % (ref 43.0–77.0)
Platelets: 606 10*3/uL — ABNORMAL HIGH (ref 150.0–400.0)
RBC: 4.06 Mil/uL (ref 3.87–5.11)
RDW: 14.8 % (ref 11.5–15.5)
WBC: 7.1 10*3/uL (ref 4.0–10.5)

## 2022-04-05 LAB — LIPID PANEL
Cholesterol: 185 mg/dL (ref 0–200)
HDL: 54.2 mg/dL (ref >39.00)
LDL Cholesterol: 102 mg/dL — ABNORMAL HIGH (ref 0–99)
NonHDL: 131.16
Total CHOL/HDL Ratio: 3
Triglycerides: 146 mg/dL (ref 0.0–149.0)
VLDL: 29.2 mg/dL (ref 0.0–40.0)

## 2022-04-05 LAB — COMPREHENSIVE METABOLIC PANEL
ALT: 9 U/L (ref 0–35)
AST: 12 U/L (ref 0–37)
Albumin: 3.8 g/dL (ref 3.5–5.2)
Alkaline Phosphatase: 41 U/L (ref 39–117)
BUN: 7 mg/dL (ref 6–23)
CO2: 24 mEq/L (ref 19–32)
Calcium: 8.8 mg/dL (ref 8.4–10.5)
Chloride: 105 mEq/L (ref 96–112)
Creatinine, Ser: 0.84 mg/dL (ref 0.40–1.20)
GFR: 82.4 mL/min (ref >60.00)
Glucose, Bld: 95 mg/dL (ref 70–99)
Potassium: 5 mEq/L (ref 3.5–5.1)
Sodium: 138 mEq/L (ref 135–145)
Total Bilirubin: 0.5 mg/dL (ref 0.2–1.2)
Total Protein: 6.5 g/dL (ref 6.0–8.3)

## 2022-04-05 LAB — TSH: TSH: 1.16 u[IU]/mL (ref 0.35–5.50)

## 2022-04-05 LAB — HEMOGLOBIN A1C: Hgb A1c MFr Bld: 6.2 % (ref 4.6–6.5)

## 2022-04-05 NOTE — Progress Notes (Signed)
Subjective  Chief Complaint  Patient presents with   Annual Exam    HPI: Brandy Cole is a 46 y.o. female who presents to [facility] [place] today for a Female Wellness Visit. She also has the concerns and/or needs as listed above in the chief complaint. These will be addressed in addition to the Health Maintenance Visit.   Wellness Visit: annual visit with health maintenance review and exam without Pap HM: sees gyn; female wellness screens are current. Reviewed pap and notes. 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 In a relationship. Only concern is weight. Declines flu vaccine.  Chronic disease f/u and/or acute problem visit: (deemed necessary to be done in addition to the wellness visit): Graves' disease: Followed by endocrinology.  She reports this is stable. Migraine headaches: Chronic and intermittent.  No significant changes.  Never needed to follow-up with neurology. Obesity: Weight trending up.  Has history of prediabetes.  Discussed diet: She enjoys eating desserts, cakes, donuts most days of the week.  This is her biggest problem.  Struggles with cutting them out of her diet.  Diet is otherwise healthy.  Does not exercise, currently recovering from an ankle sprain.  has used phentermine but causes constipation and has not worked well for her regarding appetite suppression or weight loss.  Inquires about other options.  Assessment  1. Annual physical exam   2. Graves disease   3. Migraine without aura and without status migrainosus, not intractable   4. Prediabetes   5. Obesity (BMI 30-39.9)   6. Oral contraceptive use      Plan  Female Wellness Visit: Age appropriate Health Maintenance and Prevention measures were discussed with patient. Included topics are cancer screening recommendations, ways to keep healthy (see AVS) including dietary and exercise recommendations, regular eye and dental care, use of seat belts,  and avoidance of moderate alcohol use and tobacco use. Screens are current BMI: discussed patient's BMI and encouraged positive lifestyle modifications to help get to or maintain a target BMI. HM needs and immunizations were addressed and ordered. See below for orders. See HM and immunization section for updates. Declines flu shot Routine labs and screening tests ordered including cmp, cbc and lipids where appropriate. Discussed recommendations regarding Vit D and calcium supplementation (see AVS)  Chronic disease management visit and/or acute problem visit: Graves per endocrine. Checking tsh today Migraines: continue abortive care and monitor for change in frequency or intensity. Currently stable. Uses excedrin or motrin for acute tx Prediabetes: worrisome diet and could worsen over time. Will recheck A1c today. Discussed decreasing sweets. Obesity: had long discussion regarding diet/ behavioral addictions to food/habits and how to change them. Discussed glp-1s, contrave as possible options. Discussed perimenopausal effects and need to start exercise program.   Continue ocp for cycle control.   I spent a total of 30 minutes for this patient encounter in addition to the physical component. Time spent included preparation, face-to-face counseling with the patient and coordination of care, review of chart and records, and documentation of the encounter. Counseling on addiction and nutrition.   Follow up: 12 mo for cpe; sooner if needs more help with nutrition/diet/weight loss.   Orders Placed This Encounter  Procedures   CBC with Differential/Platelet   Comprehensive metabolic panel   Hemoglobin A1c   Lipid panel   TSH   No orders of the defined types were placed in this encounter.  Body mass index is 33.16 kg/m. 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 No apnea    Migraine 10/25/2010    Overview:  Neurology - in [facility]; nl CT scan in past.    Health Maintenance  Topic Date Due   COVID-19 Vaccine (5 - 2023-24 season) 12/21/2021   MAMMOGRAM  06/14/2022   DTaP/Tdap/Td (2 - Td or Tdap) 10/20/2023   PAP SMEAR-Modifier  06/14/2026   COLONOSCOPY (Pts 45-27yrs Insurance coverage will need to be confirmed)  09/09/2030   Hepatitis C Screening  Completed   HIV Screening  Completed   HPV VACCINES  Aged Out   INFLUENZA VACCINE  Discontinued   Immunization History  Administered Date(s) Administered   Hepatitis A, Ped/Adol-2 Dose 04/29/2014   Moderna Covid-19 Vaccine Bivalent Booster 86yrs & up 03/04/2021   PFIZER(Purple Top)SARS-COV-2 Vaccination 08/11/2019, 09/01/2019, 04/06/2020   Tdap 10/19/2013   We updated and reviewed the patient's past history in detail and it is documented below. Allergies: Patient is allergic to methimazole, voltaren  [diclofenac sodium], and voltaren  [diclofenac]. Past Medical History Patient  has a past medical history of FHx: premature coronary heart disease, H/O chlamydia infection, Migraine, Oral contraceptive use (10/19/2013), and Thyroid disease. Past Surgical History Patient  has a past surgical history that includes Wisdom tooth extraction. Family History: Patient family history includes Diabetes in her father; Heart disease in her father; Hyperlipidemia in her father and mother. Social History:  Patient  reports that she has never smoked. She has never used smokeless tobacco. She reports  that she does not drink alcohol and does not use drugs.  Review of Systems: Constitutional: negative for fever or malaise Ophthalmic: negative for photophobia, double vision or loss of vision Cardiovascular: negative for chest pain, dyspnea on exertion, or new LE swelling Respiratory: negative for SOB or persistent cough Gastrointestinal: negative for abdominal pain, change in bowel habits or melena Genitourinary: negative for dysuria or gross hematuria, no abnormal uterine bleeding or disharge Musculoskeletal: negative for new gait disturbance or muscular weakness Integumentary: negative for new or persistent rashes, no breast lumps Neurological: negative for TIA or stroke symptoms Psychiatric: negative for SI or delusions Allergic/Immunologic: negative for hives  Patient Care Team    Relationship Specialty Notifications Start End  Willow Ora, MD PCP - General Family Medicine  10/31/17   Hal Morales, MD (Inactive) Consulting Physician Obstetrics and Gynecology  11/13/17   Berdine Dance, NP Nurse Practitioner Bariatrics  11/13/17   Daiva Nakayama, MD Referring Physician Internal Medicine  11/13/17   Gelene Mink, OD Referring Physician Optometry  03/06/18   Glendale Chard, DO Consulting Physician Neurology  04/03/21    Comment: migraines and abnl UE mvts,    Objective  Vitals: BP 128/68   Pulse 85   Temp 97.7 F (36.5 C)   Ht 5\' 4"  (1.626 m)   Wt  193 lb 3.2 oz (87.6 kg)   SpO2 99%   BMI 33.16 kg/m  General:  Well developed, well nourished, no acute distress  Psych:  Alert and orientedx3,normal mood and affect HEENT:  Normocephalic, atraumatic, non-icteric sclera,  supple neck without adenopathy, mass or thyromegaly Cardiovascular:  Normal S1, S2, RRR without gallop, rub or murmur Respiratory:  Good breath sounds bilaterally, CTAB with normal respiratory effort Gastrointestinal: normal bowel sounds, soft, non-tender, no noted masses. No HSM MSK: no deformities,  contusions. Joints are without erythema or swelling.  Skin:  Warm, no rashes or suspicious lesions noted Neurologic:    Mental status is normal.Gross motor and sensory exams are normal. Normal gait. No tremor   Commons side effects, risks, benefits, and alternatives for medications and treatment plan prescribed today were discussed, and the patient expressed understanding of the given instructions. Patient is instructed to call or message via MyChart if he/she has any questions or concerns regarding our treatment plan. No barriers to understanding were identified. We discussed Red Flag symptoms and signs in detail. Patient expressed understanding regarding what to do in case of urgent or emergency type symptoms.  Medication list was reconciled, printed and provided to the patient in AVS. Patient instructions and summary information was reviewed with the patient as documented in the AVS. This note was prepared with assistance of Dragon voice recognition software. Occasional wrong-word or sound-a-like substitutions may have occurred due to the inherent limitations of voice recognition software

## 2022-04-05 NOTE — Patient Instructions (Signed)
Please return in 12 months for your annual complete physical; please come fasting.   I will release your lab results to you on your MyChart account with further instructions. You may see the results before I do, but when I review them I will send you a message with my report or have my assistant call you if things need to be discussed. Please reply to my message with any questions. Thank you!   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 Let me know if you need more help/medication.   If you have any questions or concerns, please don't hesitate to send me a message via MyChart or call the office at [phone]. Thank you for visiting with [place] today! It's our pleasure caring for you.

## 2022-04-08 DIAGNOSIS — S96912D Strain of unspecified muscle and tendon at ankle and foot level, left foot, subsequent encounter: Secondary | ICD-10-CM | POA: Diagnosis not present

## 2022-04-10 DIAGNOSIS — S96912D Strain of unspecified muscle and tendon at ankle and foot level, left foot, subsequent encounter: Secondary | ICD-10-CM | POA: Diagnosis not present

## 2022-04-17 DIAGNOSIS — S96912D Strain of unspecified muscle and tendon at ankle and foot level, left foot, subsequent encounter: Secondary | ICD-10-CM | POA: Diagnosis not present

## 2022-04-18 ENCOUNTER — Other Ambulatory Visit: Payer: Self-pay | Admitting: Family Medicine

## 2022-04-18 ENCOUNTER — Other Ambulatory Visit: Payer: Self-pay

## 2022-04-18 MED ORDER — NORGESTIMATE-ETH ESTRADIOL 0.25-35 MG-MCG PO TABS: 1.0000 | ORAL_TABLET | Freq: Every day | ORAL | 1 refills | Status: DC

## 2022-04-19 DIAGNOSIS — M76822 Posterior tibial tendinitis, left leg: Secondary | ICD-10-CM | POA: Diagnosis not present

## 2022-04-23 DIAGNOSIS — S96912D Strain of unspecified muscle and tendon at ankle and foot level, left foot, subsequent encounter: Secondary | ICD-10-CM | POA: Diagnosis not present

## 2022-04-26 DIAGNOSIS — S96912D Strain of unspecified muscle and tendon at ankle and foot level, left foot, subsequent encounter: Secondary | ICD-10-CM | POA: Diagnosis not present

## 2022-05-03 DIAGNOSIS — S96912D Strain of unspecified muscle and tendon at ankle and foot level, left foot, subsequent encounter: Secondary | ICD-10-CM | POA: Diagnosis not present

## 2022-05-06 DIAGNOSIS — S96912D Strain of unspecified muscle and tendon at ankle and foot level, left foot, subsequent encounter: Secondary | ICD-10-CM | POA: Diagnosis not present

## 2022-05-09 DIAGNOSIS — S96912D Strain of unspecified muscle and tendon at ankle and foot level, left foot, subsequent encounter: Secondary | ICD-10-CM | POA: Diagnosis not present

## 2022-05-14 DIAGNOSIS — S96912D Strain of unspecified muscle and tendon at ankle and foot level, left foot, subsequent encounter: Secondary | ICD-10-CM | POA: Diagnosis not present

## 2022-05-17 DIAGNOSIS — S96912D Strain of unspecified muscle and tendon at ankle and foot level, left foot, subsequent encounter: Secondary | ICD-10-CM | POA: Diagnosis not present

## 2022-05-21 DIAGNOSIS — S96912D Strain of unspecified muscle and tendon at ankle and foot level, left foot, subsequent encounter: Secondary | ICD-10-CM | POA: Diagnosis not present

## 2022-05-23 DIAGNOSIS — S96912D Strain of unspecified muscle and tendon at ankle and foot level, left foot, subsequent encounter: Secondary | ICD-10-CM | POA: Diagnosis not present

## 2022-05-23 LAB — HM PAP SMEAR

## 2022-05-31 DIAGNOSIS — E0590 Thyrotoxicosis, unspecified without thyrotoxic crisis or storm: Secondary | ICD-10-CM | POA: Diagnosis not present

## 2022-06-14 ENCOUNTER — Ambulatory Visit (INDEPENDENT_AMBULATORY_CARE_PROVIDER_SITE_OTHER): Payer: BC Managed Care – PPO | Admitting: Podiatry

## 2022-06-14 ENCOUNTER — Ambulatory Visit (INDEPENDENT_AMBULATORY_CARE_PROVIDER_SITE_OTHER): Payer: BC Managed Care – PPO

## 2022-06-14 VITALS — BP 173/98

## 2022-06-14 DIAGNOSIS — M949 Disorder of cartilage, unspecified: Secondary | ICD-10-CM | POA: Diagnosis not present

## 2022-06-14 DIAGNOSIS — S99912A Unspecified injury of left ankle, initial encounter: Secondary | ICD-10-CM

## 2022-06-14 DIAGNOSIS — S96912S Strain of unspecified muscle and tendon at ankle and foot level, left foot, sequela: Secondary | ICD-10-CM

## 2022-06-14 DIAGNOSIS — M7752 Other enthesopathy of left foot: Secondary | ICD-10-CM | POA: Diagnosis not present

## 2022-06-14 DIAGNOSIS — M899 Disorder of bone, unspecified: Secondary | ICD-10-CM

## 2022-06-14 MED ORDER — METHYLPREDNISOLONE 4 MG PO TBPK: ORAL_TABLET | ORAL | 0 refills | Status: DC

## 2022-06-14 NOTE — Progress Notes (Signed)
Subjective:   Patient ID: Brandy Cole, female   DOB: 47 y.o.   MRN: [id number]   HPI Chief Complaint  Patient presents with   Ankle Pain    Left ankle sharpe constant pain due to injury November 2022. Patient has been seeing [facility] and received a steroid injection Dec 2023 with no relief. Patient is not taking any oral medication for pain. m    47 year old female presents the office today with above concerns.  She states originally the symptoms started after she fell at work and her heel got caught and she fell.  She was seen at [place] the same day and at that time her right foot was hurting more.  She was placed into a boot.  The next day right side was feeling better but the left side was hurting so she wear the boot on that side as well.  Reviewed this with her she does plan fasciitis and she went to physical therapy.  She then later followed up with orthopedics and the MRI was performed.  She had been doing physical therapy.  In December she had an injection performed she states that since the injection her symptoms have worsened.  She also states that a couple weeks ago with normal walking she felt A cracking sensation.    Review of Systems  All other systems reviewed and are negative.  Past Medical History:  Diagnosis Date   FHx: premature coronary heart disease    H/O chlamydia infection    Migraine    Oral contraceptive use 10/19/2013   Thyroid disease     Past Surgical History:  Procedure Laterality Date   WISDOM TOOTH EXTRACTION       Current Outpatient Medications:    methylPREDNISolone (MEDROL DOSEPAK) 4 MG TBPK tablet, Take as directed, Disp: 21 tablet, Rfl: 0   aspirin-acetaminophen-caffeine (EXCEDRIN MIGRAINE) 250-250-65 MG tablet, Take by mouth every 6 (six) hours as needed for headache., Disp: , Rfl:    Ibuprofen-diphenhydrAMINE Cit (MOTRIN PM) 200-38 MG TABS, Take by mouth., Disp: , Rfl:    norgestimate-ethinyl estradiol  (ESTARYLLA) 0.25-35 MG-MCG tablet, Take 1 tablet by mouth daily., Disp: 84 tablet, Rfl: 1   propylthiouracil (PTU) 50 MG tablet, Take 100 mg by mouth 2 (two) times daily., Disp: , Rfl:   Allergies  Allergen Reactions   Methimazole Itching   Voltaren  [Diclofenac Sodium] Other (See Comments)   Voltaren  [Diclofenac] Other (See Comments)           Objective:  Physical Exam  General: AAO x3, NAD  Dermatological: Skin is warm, dry and supple bilateral. There are no open sores, no preulcerative lesions, no rash or signs of infection present.  Vascular: Dorsalis Pedis artery and Posterior Tibial artery pedal pulses are 2/4 bilateral with immedate capillary fill time.  There is no pain with calf compression, swelling, warmth, erythema.   Neruologic: Grossly intact via light touch bilateral.   Musculoskeletal: Significant decrease in medial arch upon weightbearing bilaterally.  She is not able to do a heel rise.  She gets tenderness to palpation along the distal portion of the posterior tibial tendon near the insertion as well as well as inferior to the medial malleolus.  She has tenderness in the ankle on the lateral aspect on the lateral ankle complex as well as the lateral ankle gutter.  She also is experiencing tenderness along the course of the peroneal tendon.  Gait: Unassisted, Nonantalgic.       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  3 views of the foot and 2 views of the ankle were obtained.  Flatfoot is present.  No definitive evidence of acute fracture. -Given the ongoing nature of her symptoms and symptoms have worsened despite conservative care I would repeat the MRI.  Will continue the brace.  Prescribed Medrol Dosepak.  We  discussed acute options including conservative as well as surgical options but like the results of the MRI before pursuing further treatment.  Trula Slade DPM

## 2022-06-30 ENCOUNTER — Ambulatory Visit
Admission: RE | Admit: 2022-06-30 | Discharge: 2022-06-30 | Disposition: A | Discharge status: Home / Self Care | Payer: BC Managed Care – PPO | Source: Ambulatory Visit | Attending: Podiatry | Admitting: Podiatry

## 2022-06-30 DIAGNOSIS — M899 Disorder of bone, unspecified: Secondary | ICD-10-CM

## 2022-06-30 DIAGNOSIS — S96912S Strain of unspecified muscle and tendon at ankle and foot level, left foot, sequela: Secondary | ICD-10-CM

## 2022-06-30 DIAGNOSIS — M65872 Other synovitis and tenosynovitis, left ankle and foot: Secondary | ICD-10-CM | POA: Diagnosis not present

## 2022-06-30 DIAGNOSIS — Q666 Other congenital valgus deformities of feet: Secondary | ICD-10-CM | POA: Diagnosis not present

## 2022-06-30 DIAGNOSIS — R600 Localized edema: Secondary | ICD-10-CM | POA: Diagnosis not present

## 2022-07-05 ENCOUNTER — Ambulatory Visit (INDEPENDENT_AMBULATORY_CARE_PROVIDER_SITE_OTHER): Payer: BC Managed Care – PPO | Admitting: Podiatry

## 2022-07-05 DIAGNOSIS — M949 Disorder of cartilage, unspecified: Secondary | ICD-10-CM

## 2022-07-05 DIAGNOSIS — M76822 Posterior tibial tendinitis, left leg: Secondary | ICD-10-CM | POA: Diagnosis not present

## 2022-07-05 DIAGNOSIS — M899 Disorder of bone, unspecified: Secondary | ICD-10-CM | POA: Diagnosis not present

## 2022-07-05 DIAGNOSIS — M7752 Other enthesopathy of left foot: Secondary | ICD-10-CM

## 2022-07-05 MED ORDER — CELECOXIB 100 MG PO CAPS: 100.0000 mg | ORAL_CAPSULE | Freq: Two times a day (BID) | ORAL | 0 refills | Status: DC

## 2022-07-08 NOTE — Progress Notes (Signed)
Subjective: Chief Complaint  Patient presents with   Results    MRI, Left foot    47 year old female presents the office today for follow-up evaluation of left ankle pain and discussed MRI results.  She states that she is doing about the same and no significant change.  Objective: AAO x3, NAD DP/PT pulses palpable bilaterally, CRT less than 3 seconds Decreased medial arch upon weightbearing.  There is still tenderness palpation along the posterior tibial tendon.  She also gets tenderness in the anterolateral aspect of the ankle joint as well.  Her symptoms are unchanged. No pain with calf compression, swelling, warmth, erythema  Assessment: 47 year old female with posterior tibial tendon dysfunction  Plan: -All treatment options discussed with the patient including all alternatives, risks, complications.  -MRI was discussed with patient.  We discussed the conservative as well as surgical treatment options.  After long discussion we will try to do immobilization that she has a boot at home and her work consistently.  Prescribed Celebrex.  I seen help with the inflammation.  If she starts to improve with the immobilization will do some physical therapy to help.  Ultimately discussed surgical intervention for this if symptoms persist which I think is reasonable to do.  Patient -Patient encouraged to call the office with any questions, concerns, change in symptoms.

## 2022-07-29 DIAGNOSIS — S93401A Sprain of unspecified ligament of right ankle, initial encounter: Secondary | ICD-10-CM | POA: Diagnosis not present

## 2022-08-01 DIAGNOSIS — Z1231 Encounter for screening mammogram for malignant neoplasm of breast: Secondary | ICD-10-CM | POA: Diagnosis not present

## 2022-08-01 DIAGNOSIS — Z124 Encounter for screening for malignant neoplasm of cervix: Secondary | ICD-10-CM | POA: Diagnosis not present

## 2022-08-01 DIAGNOSIS — L918 Other hypertrophic disorders of the skin: Secondary | ICD-10-CM | POA: Diagnosis not present

## 2022-08-01 DIAGNOSIS — Z01411 Encounter for gynecological examination (general) (routine) with abnormal findings: Secondary | ICD-10-CM | POA: Diagnosis not present

## 2022-08-01 LAB — HM MAMMOGRAPHY

## 2022-08-07 ENCOUNTER — Other Ambulatory Visit: Payer: Self-pay | Admitting: Podiatry

## 2022-08-07 DIAGNOSIS — D281 Benign neoplasm of vagina: Secondary | ICD-10-CM | POA: Diagnosis not present

## 2022-08-10 LAB — HM PAP SMEAR

## 2022-08-13 DIAGNOSIS — R531 Weakness: Secondary | ICD-10-CM | POA: Diagnosis not present

## 2022-08-13 DIAGNOSIS — R262 Difficulty in walking, not elsewhere classified: Secondary | ICD-10-CM | POA: Diagnosis not present

## 2022-08-13 DIAGNOSIS — M25672 Stiffness of left ankle, not elsewhere classified: Secondary | ICD-10-CM | POA: Diagnosis not present

## 2022-08-15 DIAGNOSIS — S93412A Sprain of calcaneofibular ligament of left ankle, initial encounter: Secondary | ICD-10-CM | POA: Diagnosis not present

## 2022-08-15 DIAGNOSIS — S93492A Sprain of other ligament of left ankle, initial encounter: Secondary | ICD-10-CM | POA: Diagnosis not present

## 2022-08-15 DIAGNOSIS — G8918 Other acute postprocedural pain: Secondary | ICD-10-CM | POA: Diagnosis not present

## 2022-08-15 DIAGNOSIS — M2142 Flat foot [pes planus] (acquired), left foot: Secondary | ICD-10-CM | POA: Diagnosis not present

## 2022-08-15 DIAGNOSIS — M216X2 Other acquired deformities of left foot: Secondary | ICD-10-CM | POA: Diagnosis not present

## 2022-08-15 DIAGNOSIS — M76822 Posterior tibial tendinitis, left leg: Secondary | ICD-10-CM | POA: Diagnosis not present

## 2022-08-16 ENCOUNTER — Ambulatory Visit: Payer: BC Managed Care – PPO | Admitting: Podiatry

## 2022-08-28 DIAGNOSIS — M2142 Flat foot [pes planus] (acquired), left foot: Secondary | ICD-10-CM | POA: Diagnosis not present

## 2022-09-04 DIAGNOSIS — M2142 Flat foot [pes planus] (acquired), left foot: Secondary | ICD-10-CM | POA: Diagnosis not present

## 2022-09-05 ENCOUNTER — Other Ambulatory Visit (HOSPITAL_COMMUNITY): Payer: Self-pay

## 2022-09-05 ENCOUNTER — Encounter (HOSPITAL_COMMUNITY): Payer: Self-pay | Admitting: Student-PharmD

## 2022-09-05 ENCOUNTER — Ambulatory Visit (HOSPITAL_COMMUNITY)
Admission: RE | Admit: 2022-09-05 | Discharge: 2022-09-05 | Disposition: A | Discharge status: Home / Self Care | Payer: BC Managed Care – PPO | Source: Ambulatory Visit | Attending: Vascular Surgery | Admitting: Vascular Surgery

## 2022-09-05 ENCOUNTER — Other Ambulatory Visit (HOSPITAL_COMMUNITY): Payer: Self-pay | Admitting: Orthopaedic Surgery

## 2022-09-05 ENCOUNTER — Ambulatory Visit (HOSPITAL_BASED_OUTPATIENT_CLINIC_OR_DEPARTMENT_OTHER)
Admission: RE | Admit: 2022-09-05 | Discharge: 2022-09-05 | Disposition: A | Discharge status: Home / Self Care | Payer: BC Managed Care – PPO | Source: Ambulatory Visit | Attending: Vascular Surgery | Admitting: Vascular Surgery

## 2022-09-05 DIAGNOSIS — I82452 Acute embolism and thrombosis of left peroneal vein: Secondary | ICD-10-CM | POA: Diagnosis not present

## 2022-09-05 DIAGNOSIS — M79662 Pain in left lower leg: Secondary | ICD-10-CM | POA: Insufficient documentation

## 2022-09-05 DIAGNOSIS — Z86718 Personal history of other venous thrombosis and embolism: Secondary | ICD-10-CM | POA: Insufficient documentation

## 2022-09-05 MED ORDER — APIXABAN (ELIQUIS) VTE STARTER PACK (10MG AND 5MG)
ORAL_TABLET | ORAL | 0 refills | Status: DC
  Filled 2022-09-05: qty 74, 30d supply, fill #0

## 2022-09-05 MED ORDER — APIXABAN 5 MG PO TABS: 5.0000 mg | ORAL_TABLET | Freq: Two times a day (BID) | ORAL | 1 refills | Status: DC

## 2022-09-05 NOTE — Patient Instructions (Signed)
-  Start apixaban (Eliquis) 10 mg twice daily for 7 days followed by 5 mg twice daily. -Your refills have been sent to your [facility]. You may need to call the pharmacy to ask them to fill this when you start to run low on your current supply.  -It is important to take your medication around the same time every day.  -Avoid NSAIDs like ibuprofen (Advil, Motrin) and naproxen (Aleve) as well as aspirin doses over 100 mg daily. -Tylenol (acetaminophen) is the preferred over the counter pain medication to lower the risk of bleeding. -Be sure to alert all of your health care providers that you are taking an anticoagulant prior to starting a new medication or having a procedure. -Monitor for signs and symptoms of bleeding (abnormal bruising, prolonged bleeding, nose bleeds, bleeding from gums, discolored urine, black tarry stools). If you have fallen and hit your head OR if your bleeding is severe or not stopping, seek emergency care.  -Go to the emergency room if emergent signs and symptoms of new clot occur (new or worse swelling and pain in an arm or leg, shortness of breath, chest pain, fast or irregular heartbeats, lightheadedness, dizziness, fainting, coughing up blood) or if you experience a significant color change (pale or blue) in the extremity that has the DVT.  -We recommend you wear compression stockings as long as you are having swelling or pain. Be sure to purchase the correct size and take them off at night.   Your next visit is on Thursday June 20th at 2:30pm.  [facility] [address] [place], [place], [place] [address] Enter the hospital through Entrance C off [facility] and pull up to the [facility] entrance to the free valet parking.  Check in for your appointment at the [facility].   If you have any questions or need to reschedule an appointment, please call [phone] [facility].  If you are having an emergency, call 911 or present to  the nearest emergency room.   What is a DVT?  -Deep vein thrombosis (DVT) is a condition in which a blood clot forms in a vein of the deep venous system which can occur in the lower leg, thigh, pelvis, arm, or neck. This condition is serious and can be life-threatening if the clot travels to the arteries of the lungs and causing a blockage (pulmonary embolism, PE). A DVT can also damage veins in the leg, which can lead to long-term venous disease, leg pain, swelling, discoloration, and ulcers or sores (post-thrombotic syndrome).  -Treatment may include taking an anticoagulant medication to prevent more clots from forming and the current clot from growing, wearing compression stockings, and/or surgical procedures to remove or dissolve the clot.

## 2022-09-05 NOTE — Progress Notes (Signed)
DVT Clinic Note  Name: Brandy Cole     MRN: [id number]     DOB: [birth year]     Sex: female  PCP: Willow Ora, MD  Today's Visit: Visit Information: Initial Visit  Referred to DVT Clinic by: Dr. Susa Simmonds ([facility])  Referred to CPP by: Dr. Lenell Antu Reason for referral:  Chief Complaint  Patient presents with   DVT   HISTORY OF PRESENT ILLNESS:  Brandy Cole is a 47 y.o. female with PMH migraine, Graves disease, who presents after diagnosis of DVT for medication management. Patient reports she had surgery on her ankle for a torn tendon on 08/15/22. She was started on aspirin 325 mg daily for DVT prevention post surgery. Reports she has been quite immobile since the surgery. Onset of calf cramping was 1 week ago which has progressed in pain and intensity. She was seen by ortho yesterday who recommended an ultrasound to rule out DVT, so her cast was removed in anticipation of the imaging. Doppler today showed acute DVT of the left peroneal veins. Of note, she is on a combined oral contraceptive, which she has been on for many years.   Positive Thrombotic Risk Factors: Recent surgery (within 3 months), Estrogen therapy, Bed rest >72 hours within 3 month, Paralysis, paresis, or recent plaster cast immobilization of lower extremity Bleeding Risk Factors: None Present  Negative Thrombotic Risk Factors: Previous VTE, Smoking, Recent COVID diagnosis (within 3 months), Erythropoiesis-stimulating agent, Within 6 weeks postpartum, Recent cesarean section (within 3 months), Pregnancy, Testosterone therapy, Active cancer, Non-malignant, chronic inflammatory condition, Known thrombophilic condition, Obesity, Older age, Sedentary journey lasting >8 hours within 4 weeks, Recent trauma (within 3 months), Recent admission to hospital with acute illness (within 3 months), Central venous catheterization  Rx Insurance Coverage: Commercial Rx Affordability: Eliquis and Xarelto are both $0 Preferred Pharmacy:  Filled Eliquis starter pack today during the visit at [facility]. Refills sent to patient's preferred [facility].   Past Medical History:  Diagnosis Date   FHx: premature coronary heart disease    H/O chlamydia infection    Migraine    Oral contraceptive use 10/19/2013   Thyroid disease     Past Surgical History:  Procedure Laterality Date   WISDOM TOOTH EXTRACTION      Social History   Socioeconomic History   Marital status: Single    Spouse name: Not on file   Number of children: 0   Years of education: 16   Highest education level: Not on file  Occupational History   Occupation: [employer]  Tobacco Use   Smoking status: Never   Smokeless tobacco: Never  Vaping Use   Vaping Use: Never used  Substance and Sexual Activity   Alcohol use: No   Drug use: No   Sexual activity: Yes    Birth control/protection: Condom  Other Topics Concern   Not on file  Social History Narrative   Right handed   Drinks caffeine   Two story   Social Determinants of Health   Financial Resource Strain: Not on file  Food Insecurity: Not on file  Transportation Needs: Not on file  Physical Activity: Not on file  Stress: Not on file  Social Connections: Not on file  Intimate Partner Violence: Not on file    Family History  Problem Relation Age of Onset   Heart disease Father    Hyperlipidemia Father    Diabetes Father    Hyperlipidemia Mother  Allergies as of 09/05/2022 - Review Complete 09/05/2022  Allergen Reaction Noted   Methimazole Itching 03/06/2015    Current Outpatient Medications on File Prior to Encounter  Medication Sig Dispense Refill   acetaminophen (TYLENOL) 500 MG tablet Take 500 mg by mouth every 6 (six) hours as needed for mild pain or moderate pain.     methocarbamol (ROBAXIN) 750 MG tablet Take 750 mg by mouth every 6 (six) hours as needed for muscle spasms.     norgestimate-ethinyl estradiol (ESTARYLLA) 0.25-35 MG-MCG tablet Take  1 tablet by mouth daily. 84 tablet 1   oxyCODONE (OXY IR/ROXICODONE) 5 MG immediate release tablet Take 5 mg by mouth every 4 (four) hours as needed for moderate pain or severe pain.     propylthiouracil (PTU) 50 MG tablet Take 100 mg by mouth 2 (two) times daily.     No current facility-administered medications on file prior to encounter.   REVIEW OF SYSTEMS:  Review of Systems  Respiratory:  Negative for shortness of breath.   Cardiovascular:  Negative for chest pain, palpitations and leg swelling.  Musculoskeletal:  Positive for myalgias.  Neurological:  Positive for tingling. Negative for dizziness.   PHYSICAL EXAMINATION:  Physical Exam Vitals reviewed.  Cardiovascular:     Rate and Rhythm: Normal rate.  Pulmonary:     Effort: Pulmonary effort is normal.  Musculoskeletal:        General: Tenderness present. No swelling.  Skin:    Findings: No erythema.  Psychiatric:        Mood and Affect: Mood normal.        Behavior: Behavior normal.        Thought Content: Thought content normal.   Villalta Score for Post-Thrombotic Syndrome: Pain: Severe Cramps: Severe Heaviness: Mild Paresthesia: Mild Pruritus: Absent Pretibial Edema: Absent Skin Induration: Absent Hyperpigmentation: Absent Redness: Absent Venous Ectasia: Absent Pain on calf compression: Mild Villalta Preliminary Score: 9 Is venous ulcer present?: No If venous ulcer is present and score is <15, then 15 points total are assigned: Absent Villalta Total Score: 9  LABS:  CBC     Component Value Date/Time   WBC 7.1 04/05/2022 1003   RBC 4.06 04/05/2022 1003   HGB 12.2 04/05/2022 1003   HCT 36.7 04/05/2022 1003   PLT 606.0 (H) 04/05/2022 1003   MCV 90.5 04/05/2022 1003   MCH 30.6 03/10/2020 1032   MCHC 33.3 04/05/2022 1003   RDW 14.8 04/05/2022 1003   LYMPHSABS 1.7 04/05/2022 1003   MONOABS 0.5 04/05/2022 1003   EOSABS 0.1 04/05/2022 1003   BASOSABS 0.1 04/05/2022 1003    Hepatic Function       Component Value Date/Time   PROT 6.5 04/05/2022 1003   ALBUMIN 3.8 04/05/2022 1003   AST 12 04/05/2022 1003   ALT 9 04/05/2022 1003   ALKPHOS 41 04/05/2022 1003   BILITOT 0.5 04/05/2022 1003    Renal Function   Lab Results  Component Value Date   CREATININE 0.84 04/05/2022   CREATININE 0.85 04/03/2021   CREATININE 0.91 03/10/2020    CrCl cannot be calculated (Patient's most recent lab result is older than the maximum 21 days allowed.).   [facility] Vascular Lab Studies:  09/05/22 VAS [place] LOWER EXTREMITY VENOUS LEFT (DVT)  Summary:  RIGHT:  - No evidence of common femoral vein obstruction.    LEFT:  - Findings consistent with acute deep vein thrombosis involving the left  peroneal veins.  - No cystic structure found in the popliteal fossa.  ASSESSMENT: Location of DVT: Left distal vein Cause of DVT: provoked by a transient risk factor - recent L ankle surgery followed by prolonged immobility. Aspirin 325 mg was started post surgery for DVT prophylaxis. We will discontinue this today and start anticoagulation with Eliquis with plans to treat for 3 months for a provoked DVT. Of note, patient is taking an estrogen containing oral contraceptive. She has been on this for many years. Encouraged patient to discuss with her gynecologist or PCP who is prescribing this whether this needs to be discontinued given new history of DVT, and estrogen not recommended in patients with history of VTE.   PLAN: -Discontinue aspirin.  -Start apixaban (Eliquis) 10 mg twice daily for 7 days followed by 5 mg twice daily. -Expected duration of therapy: 3 months. Therapy started on 09/05/22. -Patient educated on purpose, proper use and potential adverse effects of apixaban (Eliquis). -Discussed importance of taking medication around the same time every day. -Advised patient of medications to avoid (NSAIDs, aspirin doses >100 mg daily). -Educated that Tylenol (acetaminophen) is the preferred analgesic to lower  the risk of bleeding. -Advised patient to alert all providers of anticoagulation therapy prior to starting a new medication or having a procedure. -Emphasized importance of monitoring for signs and symptoms of bleeding (abnormal bruising, prolonged bleeding, nose bleeds, bleeding from gums, discolored urine, black tarry stools). -Educated patient to present to the ED if emergent signs and symptoms of new thrombosis occur. -Counseled patient to wear compression stockings daily, removing at night.  Follow up: 1 month in DVT Clinic  Pervis Hocking, PharmD, [place], CPP Deep [facility]in Thrombosis Clinic Clinical Pharmacist Practitioner Office: [phone]

## 2022-09-09 ENCOUNTER — Other Ambulatory Visit: Payer: Self-pay | Admitting: Podiatry

## 2022-09-11 DIAGNOSIS — R519 Headache, unspecified: Secondary | ICD-10-CM | POA: Diagnosis not present

## 2022-09-11 DIAGNOSIS — G43909 Migraine, unspecified, not intractable, without status migrainosus: Secondary | ICD-10-CM | POA: Diagnosis not present

## 2022-09-11 DIAGNOSIS — K644 Residual hemorrhoidal skin tags: Secondary | ICD-10-CM | POA: Diagnosis not present

## 2022-09-23 DIAGNOSIS — Z9889 Other specified postprocedural states: Secondary | ICD-10-CM | POA: Diagnosis not present

## 2022-09-23 DIAGNOSIS — M2142 Flat foot [pes planus] (acquired), left foot: Secondary | ICD-10-CM | POA: Diagnosis not present

## 2022-09-24 NOTE — Progress Notes (Deleted)
Follow-up Visit   Date: 09/25/2022    Brandy Cole MRN: [id number] DOB: 1976-02-07    Brandy Cole is a 47 y.o. ***-handed Caucasian/*** female with *** returning to the clinic for follow-up of ***.  The patient was accompanied to the clinic by *** who also provides collateral information.    IMPRESSION/PLAN: ***  Return to clinic in ***  --------------------------------------------- History of present illness: She was last seen in 2021 for ***.   She has migraines for the past 10 years.  She has daily headache, described as throbbing.  She has associated nasuea, photophobia, and phonophobia at times.  Triggers include foods, sun, noise, sugars.  She was previously followed at [facility] and has been on a number of medications, most recently on Trokendi (memory changes).  She has not done neck PT.  She does not wish to do botox. Per notes, prior medications include: Current and past medications: ANALGESICS:Aspirin,BC Goody's,Excedrin,Hydrocodone,Tylenol,Tramadol ANTI-MIGRAINE:Imitrex tab,Maxalt,Relpax HEART/BP:  DECONGESTANT/ANTIHISTAMINE:Allegra,ClaritinSudafed,Zyrtec ANTI-NAUSEANT NSAIDS:Advil,,Aleve,Meloxicam,Naproxen MUSCLE RELAXANTS: Amrixs,Flexeril ANTI-CONVULSANTS:Depakote, Topamax, Trokendi, zonegran STEROIDS:Prednisone SLEEPING PILLS/TRANQUILIZERS:Ambien,Tylnol PM ANTI-DEPRESSANTS:Amitriptyline HERBAL: Coenzyme Q10, Magnesium FIBROMYALGIA:  HORMONAL: OTHER:  PROCEDURES FOR HEADACHES:  She works as an Pensions consultant at [employer].    UPDATE ***:  Medications:  Current Outpatient Medications on File Prior to Visit  Medication Sig Dispense Refill   acetaminophen (TYLENOL) 500 MG tablet Take 500 mg by mouth every 6 (six) hours as needed for mild pain or moderate pain.     apixaban (ELIQUIS) 5 MG TABS tablet Take 1 tablet (5 mg total) by mouth 2 (two) times daily. Start taking after completion of starter pack. 60 tablet 1   APIXABAN (ELIQUIS)  VTE STARTER PACK (10MG  AND 5MG ) Take as directed on package: start with two-5mg  tablets twice daily for 7 days. On day 8, switch to one-5mg  tablet twice daily. 74 each 0   methocarbamol (ROBAXIN) 750 MG tablet Take 750 mg by mouth every 6 (six) hours as needed for muscle spasms.     norgestimate-ethinyl estradiol (ESTARYLLA) 0.25-35 MG-MCG tablet Take 1 tablet by mouth daily. 84 tablet 1   oxyCODONE (OXY IR/ROXICODONE) 5 MG immediate release tablet Take 5 mg by mouth every 4 (four) hours as needed for moderate pain or severe pain.     propylthiouracil (PTU) 50 MG tablet Take 100 mg by mouth 2 (two) times daily.     No current facility-administered medications on file prior to visit.    Allergies:  Allergies  Allergen Reactions   Methimazole Itching    Vital Signs:  There were no vitals taken for this visit.   General Medical Exam:   General:  Well appearing, comfortable  Eyes/ENT: see cranial nerve examination.   Neck:  No carotid bruits. Respiratory:  Clear to auscultation, good air entry bilaterally.   Cardiac:  Regular rate and rhythm, no murmur.   Ext:  No edema ***  Neurological Exam: MENTAL STATUS including orientation to time, place, person, recent and remote memory, attention span and concentration, language, and fund of knowledge is ***normal.  Speech is not dysarthric.  CRANIAL NERVES:  No visual field defects.  Pupils equal round and reactive to light.  Normal conjugate, extra-ocular eye movements in all directions of gaze.  No ptosis ***.  Face is symmetric. Palate elevates symmetrically.  Tongue is midline.  MOTOR:  Motor strength is 5/5 in all extremities, ***.  No atrophy, fasciculations or abnormal movements.  No pronator drift.  Tone is normal.    MSRs:  Reflexes are 2+/4 throughout ***.  SENSORY:  Intact to vibration throughout ***.  COORDINATION/GAIT:  Normal finger-to- nose-finger.  Intact rapid alternating movements bilaterally.  Gait narrow based and stable.    Data:***    Total time spent:  ***  Thank you for allowing me to participate in patient's care.  If I can answer any additional questions, I would be pleased to do so.    Sincerely,    Shonte Beutler K. Allena Katz, DO

## 2022-09-25 ENCOUNTER — Ambulatory Visit: Payer: BC Managed Care – PPO | Admitting: Neurology

## 2022-10-01 ENCOUNTER — Encounter: Payer: Self-pay | Admitting: Neurology

## 2022-10-01 ENCOUNTER — Other Ambulatory Visit: Payer: Self-pay | Admitting: Family Medicine

## 2022-10-01 ENCOUNTER — Ambulatory Visit (INDEPENDENT_AMBULATORY_CARE_PROVIDER_SITE_OTHER): Payer: BC Managed Care – PPO | Admitting: Neurology

## 2022-10-01 VITALS — BP 131/84 | HR 101 | Ht 64.0 in | Wt 180.0 lb

## 2022-10-01 DIAGNOSIS — G43709 Chronic migraine without aura, not intractable, without status migrainosus: Secondary | ICD-10-CM

## 2022-10-01 DIAGNOSIS — R2689 Other abnormalities of gait and mobility: Secondary | ICD-10-CM | POA: Diagnosis not present

## 2022-10-01 DIAGNOSIS — M25672 Stiffness of left ankle, not elsewhere classified: Secondary | ICD-10-CM | POA: Diagnosis not present

## 2022-10-01 MED ORDER — AIMOVIG 70 MG/ML ~~LOC~~ SOAJ: 70.0000 mg | SUBCUTANEOUS | 11 refills | Status: DC

## 2022-10-01 NOTE — Progress Notes (Signed)
Follow-up Visit   Date: 10/01/2022    Brandy Cole MRN: [id number] DOB: 1975/05/26    Brandy Cole is a 47 y.o. right-handed  female with Graves disease returning to the clinic for follow-up of daily headaches.  The patient was accompanied to the clinic by self.    IMPRESSION/PLAN: Chronic migraine with daily headaches  -  Previously tried: topiramate, Trokendi, amitriptyline, depakote, zonisamide, magnesium, coenzyme q10, NSAIDs, imitrex, maxalt, and relpax.  - Start Aimovig 70mg  every 30 days  2.   Medication overuse headaches  - Limit all NSAIDs and tylenol to twice per week to prevent medication overuse headaches  Return to clinic in 3 months  --------------------------------------------- UPDATE 10/01/2022:  She has a long history of headaches for the past 25 years.  Headaches are bifrontal and at the base of the neck.  Pain is described as dull, achy, and throbbing.  She endorses photophobia and phonophobia. She would have a dull headache upon wakening and around 9am she takes excedrin which prevent the pain for getting worse.  Sometimes, she gets a headache in the evening, she would take motrin.  She was seeing [facility] for several years, last in 2017.  She has been on a number of medications including topiramate, Trokendi, amitriptyline, depakote, zonisamide, magnesium, coenzyme q10, NSAIDs, imitrex, maxalt, and relpax.  She is currently not taking preventative medication.  Her headaches were better when she was taking oral contraceptive medication, however recently she was diagnosed with left leg DVT in the setting of surgery for left tendon repair, so OCP was stopped. Prior to starting anticoagulation, she was taking Excedrin daily.   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     Medications:  Current Outpatient Medications on File Prior to Visit  Medication Sig Dispense Refill   acetaminophen (TYLENOL) 500 MG tablet Take  500 mg by mouth every 6 (six) hours as needed for mild pain or moderate pain.     apixaban (ELIQUIS) 5 MG TABS tablet Take 1 tablet (5 mg total) by mouth 2 (two) times daily. Start taking after completion of starter pack. 60 tablet 1   APIXABAN (ELIQUIS) VTE STARTER PACK (10MG  AND 5MG ) Take as directed on package: start with two-5mg  tablets twice daily for 7 days. On day 8, switch to one-5mg  tablet twice daily. 74 each 0   methocarbamol (ROBAXIN) 750 MG tablet Take 750 mg by mouth every 6 (six) hours as needed for muscle spasms.     oxyCODONE (OXY IR/ROXICODONE) 5 MG immediate release tablet Take 5 mg by mouth every 4 (four) hours as needed for moderate pain or severe pain.     propylthiouracil (PTU) 50 MG tablet Take 100 mg by mouth 2 (two) times daily.     No current facility-administered medications on file prior to visit.    Allergies:  Allergies  Allergen Reactions   Methimazole Itching    Vital Signs:  BP 131/84   Pulse (!) 101   Ht 5\' 4"  (1.626 m)   Wt 180 lb (81.6 kg)   SpO2 91%   BMI 30.90 kg/m     Neurological Exam: MENTAL STATUS including orientation to time, place, person, recent and remote memory, attention span and concentration, language, and fund of knowledge is normal.  Speech is not dysarthric.  CRANIAL NERVES:  No visual field defects.  Pupils equal round and reactive to light.  Normal conjugate, extra-ocular eye movements in all directions of gaze.  No ptosis.  Face is symmetric.  MOTOR:  Motor strength is 5/5 in all extremities, testing of the left lower extremity was limited as it is supported in a boot.  No atrophy, fasciculations or abnormal movements.  No pronator drift.  Tone is normal.    MSRs:  Reflexes are 2+/4 throughout.  SENSORY:  Intact to vibration throughout .  COORDINATION/GAIT:  Normal finger-to- nose-finger.  Intact rapid alternating movements bilaterally.  Gait was not assessed  Data:n.a   Thank you for allowing me to participate in  patient's care.  If I can answer any additional questions, I would be pleased to do so.    Sincerely,    Juluis Fitzsimmons K. Allena Katz, DO

## 2022-10-01 NOTE — Patient Instructions (Addendum)
Start Aimovig 70mg  injections once per month  Limit all over counter medications to twice per week  Return to clinic 3 months

## 2022-10-02 MED ORDER — NORGESTIMATE-ETH ESTRADIOL 0.25-35 MG-MCG PO TABS: 1.0000 | ORAL_TABLET | Freq: Every day | ORAL | 3 refills | Status: DC

## 2022-10-02 NOTE — Addendum Note (Signed)
Addended by: Asencion Partridge on: 10/02/2022 12:08 PM   Modules accepted: Orders

## 2022-10-03 DIAGNOSIS — R2689 Other abnormalities of gait and mobility: Secondary | ICD-10-CM | POA: Diagnosis not present

## 2022-10-03 DIAGNOSIS — M25672 Stiffness of left ankle, not elsewhere classified: Secondary | ICD-10-CM | POA: Diagnosis not present

## 2022-10-07 DIAGNOSIS — M25672 Stiffness of left ankle, not elsewhere classified: Secondary | ICD-10-CM | POA: Diagnosis not present

## 2022-10-07 DIAGNOSIS — R2689 Other abnormalities of gait and mobility: Secondary | ICD-10-CM | POA: Diagnosis not present

## 2022-10-10 ENCOUNTER — Ambulatory Visit (HOSPITAL_COMMUNITY)
Admission: RE | Admit: 2022-10-10 | Discharge: 2022-10-10 | Disposition: A | Discharge status: Home / Self Care | Payer: BC Managed Care – PPO | Source: Ambulatory Visit | Attending: Vascular Surgery | Admitting: Vascular Surgery

## 2022-10-10 ENCOUNTER — Encounter (HOSPITAL_COMMUNITY): Payer: Self-pay

## 2022-10-10 VITALS — BP 121/77 | HR 88

## 2022-10-10 DIAGNOSIS — I82452 Acute embolism and thrombosis of left peroneal vein: Secondary | ICD-10-CM | POA: Insufficient documentation

## 2022-10-10 DIAGNOSIS — R2689 Other abnormalities of gait and mobility: Secondary | ICD-10-CM | POA: Diagnosis not present

## 2022-10-10 DIAGNOSIS — M25672 Stiffness of left ankle, not elsewhere classified: Secondary | ICD-10-CM | POA: Diagnosis not present

## 2022-10-10 NOTE — Patient Instructions (Signed)
-  Continue apixaban (Eliquis) 5 mg twice daily. -Your refills have [facility] your [facility].  -It is important to take your medication around the same time every day.  -Avoid NSAIDs like ibuprofen (Advil, Motrin) and naproxen (Aleve) as well as aspirin doses over 100 mg daily. -Tylenol (acetaminophen) is the preferred over the counter pain medication to lower the risk of bleeding. -Be sure to alert all of your health care providers that you are taking an anticoagulant prior to starting a new medication or having a procedure. -Monitor for signs and symptoms of bleeding (abnormal bruising, prolonged bleeding, nose bleeds, bleeding from gums, discolored urine, black tarry stools). If you have fallen and hit your head OR if your bleeding is severe or not stopping, seek emergency care.  -Go to the emergency room if emergent signs and symptoms of new clot occur (new or worse swelling and pain in an arm or leg, shortness of breath, chest pain, fast or irregular heartbeats, lightheadedness, dizziness, fainting, coughing up blood) or if you experience a significant color change (pale or blue) in the extremity that has the DVT.  -We recommend you wear compression stockings (15-20 or 20-30) and elevate your leg as long as you are having swelling or pain. Be sure to purchase the correct size and take them off at night.   Your next visit is on August 19th at 2:30pm.  [facility] [address] [place], [place], [place] [address] Enter the hospital through Entrance C off [facility] and pull up to the [facility] Vascular Center entrance to the free valet parking.  Check in for your appointment at the Heart [facility] Vascular Center.   If you have any questions or need to reschedule an appointment, please call [phone] [facility].  If you are having an emergency, call 911 or present to the nearest emergency room.   What is a DVT?  -Deep vein thrombosis (DVT) is a condition in which a blood clot  forms in a vein of the deep venous system which can occur in the lower leg, thigh, pelvis, arm, or neck. This condition is serious and can be life-threatening if the clot travels to the arteries of the lungs and causing a blockage (pulmonary embolism, PE). A DVT can also damage veins in the leg, which can lead to long-term venous disease, leg pain, swelling, discoloration, and ulcers or sores (post-thrombotic syndrome).  -Treatment may include taking an anticoagulant medication to prevent more clots from forming and the current clot from growing, wearing compression stockings, and/or surgical procedures to remove or dissolve the clot.

## 2022-10-10 NOTE — Progress Notes (Signed)
DVT Clinic Note  Name: Brandy Cole     MRN: [id number]     DOB: 02/15/1976     Sex: female  PCP: Willow Ora, MD  Today's Visit: Visit Information: Follow Up Visit  Referred to DVT Clinic by: Dr. Susa Simmonds ([facility])   Referred to CPP by: Dr. Chestine Spore Reason for referral:  Chief Complaint  Patient presents with   Med Management - DVT   HISTORY OF PRESENT ILLNESS: Brandy Cole is a 47 y.o. female with PMH migraine, Graves disease, who presents for follow up medication management after diagnosis of DVT on 09/05/22 s/p orthopedic surgery on her left ankle for a torn tendon on 08/15/22 after which she was not very mobile and her leg was in a cast. Last seen in DVT Clinic 09/05/22 at which time Eliquis was started with plans to treat provoked DVT for 3 months.   Today patient reports her pain and calf swelling have resolved. She is using a scooter today and her leg is still in a boot. She does endorse some swelling of the left foot. Denies abnormal bleeding or bruising. Denies missed doses of Eliquis. She plans to start wearing compression stockings to help with the swelling still in her foot. Since her last visit, she called her gynecologist to inform them of her DVT diagnosis, and they discontinued her estrogen containing oral contraceptive.   Positive Thrombotic Risk Factors: Recent surgery (within 3 months), Paralysis, paresis, or recent plaster cast immobilization of lower extremity, Bed rest >72 hours within 3 month, Estrogen therapy Bleeding Risk Factors: Anticoagulant therapy  Negative Thrombotic Risk Factors: Previous VTE, Recent trauma (within 3 months), Recent admission to hospital with acute illness (within 3 months), Central venous catheterization, Sedentary journey lasting >8 hours within 4 weeks, Pregnancy, Within 6 weeks postpartum, Recent cesarean section (within 3 months), Estrogen therapy, Testosterone therapy, Active cancer, Recent COVID diagnosis (within 3 months),  Erythropoiesis-stimulating agent, Non-malignant, chronic inflammatory condition, Known thrombophilic condition, Smoking, Older age, Obesity  Rx Insurance Coverage: Commercial Rx Affordability: Eliquis and Xarelto are both $0/month.  Preferred Pharmacy: Filled Eliquis starter pack during last visit at [facility]. Refills sent to [facility]'s preferred [facility].   Past Medical History:  Diagnosis Date   FHx: premature coronary heart disease    H/O chlamydia infection    Migraine    Oral contraceptive use 10/19/2013   Thyroid disease    Torn tendon     Past Surgical History:  Procedure Laterality Date   torn tendon     WISDOM TOOTH EXTRACTION      Social History   Socioeconomic History   Marital status: Single    Spouse name: Not on file   Number of children: 0   Years of education: 16   Highest education level: Not on file  Occupational History   Occupation: [employer]  Tobacco Use   Smoking status: Never   Smokeless tobacco: Never  Vaping Use   Vaping Use: Never used  Substance and Sexual Activity   Alcohol use: No   Drug use: No   Sexual activity: Yes    Birth control/protection: Condom  Other Topics Concern   Not on file  Social History Narrative   Right handed   Drinks caffeine   Two story   Social Determinants of Health   Financial Resource Strain: Not on file  Food Insecurity: Not on file  Transportation Needs: Not on file  Physical Activity: Not on file  Stress: Not on file  Social Connections: Not on file  Intimate Partner Violence: Not on file    Family History  Problem Relation Age of Onset   Heart disease Father    Hyperlipidemia Father    Diabetes Father    Hyperlipidemia Mother     Allergies as of 10/10/2022 - Review Complete 10/10/2022  Allergen Reaction Noted   Methimazole Itching 03/06/2015    Current Outpatient Medications on File Prior to Encounter  Medication Sig Dispense Refill   acetaminophen  (TYLENOL) 500 MG tablet Take 500 mg by mouth every 6 (six) hours as needed for mild pain or moderate pain.     apixaban (ELIQUIS) 5 MG TABS tablet Take 1 tablet (5 mg total) by mouth 2 (two) times daily. Start taking after completion of starter pack. 60 tablet 1   aspirin-acetaminophen-caffeine (EXCEDRIN MIGRAINE) 250-250-65 MG tablet Take by mouth every 6 (six) hours as needed for headache.     b complex vitamins capsule Take 1 capsule by mouth daily.     propylthiouracil (PTU) 50 MG tablet Take 100 mg by mouth 2 (two) times daily.     TURMERIC PO Take 1 capsule by mouth daily.     Vitamin D, Ergocalciferol, (DRISDOL) 1.25 MG (50000 UNIT) CAPS capsule Take 50,000 Units by mouth every 7 (seven) days.     Erenumab-aooe (AIMOVIG) 70 MG/ML SOAJ Inject 70 mg into the skin every 30 (thirty) days. 1.12 mL 11   No current facility-administered medications on file prior to encounter.   REVIEW OF SYSTEMS:  Review of Systems  Respiratory:  Negative for shortness of breath.   Cardiovascular:  Negative for chest pain, palpitations and leg swelling.  Musculoskeletal:  Negative for myalgias.  Neurological:  Negative for dizziness and tingling.   PHYSICAL EXAMINATION:  Vitals:   10/10/22 1437  BP: 121/77  Pulse: 88  SpO2: 100%    Physical Exam Vitals reviewed.  Cardiovascular:     Rate and Rhythm: Normal rate.  Pulmonary:     Effort: Pulmonary effort is normal.  Musculoskeletal:        General: No tenderness.     Left lower leg: No edema.  Skin:    Findings: No bruising or erythema.  Psychiatric:        Mood and Affect: Mood normal.        Behavior: Behavior normal.        Thought Content: Thought content normal.   Villalta Score for Post-Thrombotic Syndrome: Pain: Absent Cramps: Absent Heaviness: Mild Paresthesia: Absent Pruritus: Absent Pretibial Edema: Absent Skin Induration: Absent Hyperpigmentation: Absent Redness: Absent Venous Ectasia: Absent Pain on calf compression:  Absent Villalta Preliminary Score: 1 Is venous ulcer present?: No If venous ulcer is present and score is <15, then 15 points total are assigned: Absent Villalta Total Score: 1  LABS:  CBC     Component Value Date/Time   WBC 7.1 04/05/2022 1003   RBC 4.06 04/05/2022 1003   HGB 12.2 04/05/2022 1003   HCT 36.7 04/05/2022 1003   PLT 606.0 (H) 04/05/2022 1003   MCV 90.5 04/05/2022 1003   MCH 30.6 03/10/2020 1032   MCHC 33.3 04/05/2022 1003   RDW 14.8 04/05/2022 1003   LYMPHSABS 1.7 04/05/2022 1003   MONOABS 0.5 04/05/2022 1003   EOSABS 0.1 04/05/2022 1003   BASOSABS 0.1 04/05/2022 1003    Hepatic Function      Component Value Date/Time   PROT 6.5 04/05/2022 1003   ALBUMIN 3.8 04/05/2022 1003  AST 12 04/05/2022 1003   ALT 9 04/05/2022 1003   ALKPHOS 41 04/05/2022 1003   BILITOT 0.5 04/05/2022 1003    Renal Function   Lab Results  Component Value Date   CREATININE 0.84 04/05/2022   CREATININE 0.85 04/03/2021   CREATININE 0.91 03/10/2020    CrCl cannot be calculated (Patient's most recent lab result is older than the maximum 21 days allowed.).   VVS Vascular Lab Studies:  09/05/22 VAS [place] LOWER EXTREMITY VENOUS LEFT (DVT)  Summary:  RIGHT:  - No evidence of common femoral vein obstruction.   LEFT:  - Findings consistent with acute deep vein thrombosis involving the left  peroneal veins.  - No cystic structure found in the popliteal fossa.   ASSESSMENT: Location of DVT: Left distal vein Cause of DVT: provoked by a transient risk factor - recent L ankle surgery followed by prolonged immobility. Plan has been to anticoagulate with Eliquis for 3 months for a provoked DVT. Her symptoms started to resolve within about a week of starting Eliquis. She continues to have some swelling in her foot related to the surgery. She is tolerating Eliquis well with no missed doses or adverse effects. No issues with medication access. Her estrogen containing birth control was appropriately  discontinued by her gynecologist.   PLAN: -Continue apixaban (Eliquis) 5 mg twice daily. -Expected duration of therapy: 3 months. Therapy started on 09/05/22. -Patient educated on purpose, proper use and potential adverse effects of apixaban (Eliquis). -Discussed importance of taking medication around the same time every day. -Advised patient of medications to avoid (NSAIDs, aspirin doses >100 mg daily). -Educated that Tylenol (acetaminophen) is the preferred analgesic to lower the risk of bleeding. -Advised patient to alert all providers of anticoagulation therapy prior to starting a new medication or having a procedure. -Emphasized importance of monitoring for signs and symptoms of bleeding (abnormal bruising, prolonged bleeding, nose bleeds, bleeding from gums, discolored urine, black tarry stools). -Educated patient to present to the ED if emergent signs and symptoms of new thrombosis occur. -Counseled patient to wear compression stockings daily, removing at night.  Follow up: 2 months in DVT Clinic at end of treatment.  Pervis Hocking, PharmD, [place]y [place], CPP Deep Vein Thrombosis Clinic Clinical Pharmacist Practitioner Office: [phone]

## 2022-10-11 DIAGNOSIS — H16223 Keratoconjunctivitis sicca, not specified as Sjogren's, bilateral: Secondary | ICD-10-CM | POA: Diagnosis not present

## 2022-10-14 DIAGNOSIS — R2689 Other abnormalities of gait and mobility: Secondary | ICD-10-CM | POA: Diagnosis not present

## 2022-10-14 DIAGNOSIS — M25672 Stiffness of left ankle, not elsewhere classified: Secondary | ICD-10-CM | POA: Diagnosis not present

## 2022-10-17 DIAGNOSIS — R2689 Other abnormalities of gait and mobility: Secondary | ICD-10-CM | POA: Diagnosis not present

## 2022-10-17 DIAGNOSIS — M25672 Stiffness of left ankle, not elsewhere classified: Secondary | ICD-10-CM | POA: Diagnosis not present

## 2022-10-22 DIAGNOSIS — R2689 Other abnormalities of gait and mobility: Secondary | ICD-10-CM | POA: Diagnosis not present

## 2022-10-22 DIAGNOSIS — M25672 Stiffness of left ankle, not elsewhere classified: Secondary | ICD-10-CM | POA: Diagnosis not present

## 2022-10-25 DIAGNOSIS — R2689 Other abnormalities of gait and mobility: Secondary | ICD-10-CM | POA: Diagnosis not present

## 2022-10-25 DIAGNOSIS — M25672 Stiffness of left ankle, not elsewhere classified: Secondary | ICD-10-CM | POA: Diagnosis not present

## 2022-10-29 DIAGNOSIS — M25672 Stiffness of left ankle, not elsewhere classified: Secondary | ICD-10-CM | POA: Diagnosis not present

## 2022-10-29 DIAGNOSIS — R2689 Other abnormalities of gait and mobility: Secondary | ICD-10-CM | POA: Diagnosis not present

## 2022-10-31 DIAGNOSIS — R2689 Other abnormalities of gait and mobility: Secondary | ICD-10-CM | POA: Diagnosis not present

## 2022-10-31 DIAGNOSIS — M25672 Stiffness of left ankle, not elsewhere classified: Secondary | ICD-10-CM | POA: Diagnosis not present

## 2022-11-04 DIAGNOSIS — M25672 Stiffness of left ankle, not elsewhere classified: Secondary | ICD-10-CM | POA: Diagnosis not present

## 2022-11-06 DIAGNOSIS — M25672 Stiffness of left ankle, not elsewhere classified: Secondary | ICD-10-CM | POA: Diagnosis not present

## 2022-11-06 DIAGNOSIS — R2689 Other abnormalities of gait and mobility: Secondary | ICD-10-CM | POA: Diagnosis not present

## 2022-11-07 DIAGNOSIS — M25672 Stiffness of left ankle, not elsewhere classified: Secondary | ICD-10-CM | POA: Diagnosis not present

## 2022-11-07 DIAGNOSIS — R2689 Other abnormalities of gait and mobility: Secondary | ICD-10-CM | POA: Diagnosis not present

## 2022-11-11 DIAGNOSIS — R2689 Other abnormalities of gait and mobility: Secondary | ICD-10-CM | POA: Diagnosis not present

## 2022-11-11 DIAGNOSIS — M25672 Stiffness of left ankle, not elsewhere classified: Secondary | ICD-10-CM | POA: Diagnosis not present

## 2022-11-14 DIAGNOSIS — M25672 Stiffness of left ankle, not elsewhere classified: Secondary | ICD-10-CM | POA: Diagnosis not present

## 2022-11-14 DIAGNOSIS — R2689 Other abnormalities of gait and mobility: Secondary | ICD-10-CM | POA: Diagnosis not present

## 2022-11-19 DIAGNOSIS — R2689 Other abnormalities of gait and mobility: Secondary | ICD-10-CM | POA: Diagnosis not present

## 2022-11-19 DIAGNOSIS — M25672 Stiffness of left ankle, not elsewhere classified: Secondary | ICD-10-CM | POA: Diagnosis not present

## 2022-11-21 ENCOUNTER — Other Ambulatory Visit: Payer: Self-pay | Admitting: Student-PharmD

## 2022-11-22 DIAGNOSIS — M25672 Stiffness of left ankle, not elsewhere classified: Secondary | ICD-10-CM | POA: Diagnosis not present

## 2022-11-22 DIAGNOSIS — R2689 Other abnormalities of gait and mobility: Secondary | ICD-10-CM | POA: Diagnosis not present

## 2022-11-25 ENCOUNTER — Ambulatory Visit: Payer: BC Managed Care – PPO | Admitting: Neurology

## 2022-11-25 DIAGNOSIS — M25672 Stiffness of left ankle, not elsewhere classified: Secondary | ICD-10-CM | POA: Diagnosis not present

## 2022-11-25 DIAGNOSIS — R2689 Other abnormalities of gait and mobility: Secondary | ICD-10-CM | POA: Diagnosis not present

## 2022-11-27 DIAGNOSIS — R2689 Other abnormalities of gait and mobility: Secondary | ICD-10-CM | POA: Diagnosis not present

## 2022-11-27 DIAGNOSIS — M25672 Stiffness of left ankle, not elsewhere classified: Secondary | ICD-10-CM | POA: Diagnosis not present

## 2022-12-02 DIAGNOSIS — M25672 Stiffness of left ankle, not elsewhere classified: Secondary | ICD-10-CM | POA: Diagnosis not present

## 2022-12-02 DIAGNOSIS — R2689 Other abnormalities of gait and mobility: Secondary | ICD-10-CM | POA: Diagnosis not present

## 2022-12-05 DIAGNOSIS — R2689 Other abnormalities of gait and mobility: Secondary | ICD-10-CM | POA: Diagnosis not present

## 2022-12-05 DIAGNOSIS — M25672 Stiffness of left ankle, not elsewhere classified: Secondary | ICD-10-CM | POA: Diagnosis not present

## 2022-12-09 ENCOUNTER — Ambulatory Visit (HOSPITAL_COMMUNITY)
Admission: RE | Admit: 2022-12-09 | Discharge: 2022-12-09 | Disposition: A | Discharge status: Home / Self Care | Payer: BC Managed Care – PPO | Source: Ambulatory Visit | Attending: Vascular Surgery | Admitting: Vascular Surgery

## 2022-12-09 ENCOUNTER — Encounter (HOSPITAL_COMMUNITY): Payer: Self-pay

## 2022-12-09 VITALS — BP 146/78 | HR 85

## 2022-12-09 DIAGNOSIS — I82452 Acute embolism and thrombosis of left peroneal vein: Secondary | ICD-10-CM | POA: Insufficient documentation

## 2022-12-09 NOTE — Progress Notes (Signed)
DVT Clinic Note  Name: Brandy Cole     MRN: [id number]     DOB: 10-13-1975     Sex: female  PCP: Willow Ora, MD  Today's Visit: Visit Information: Discharge Visit  Referred to DVT Clinic by: Orthopedic Surgery - Dr. Susa Simmonds ([facility])   Referred to CPP by: Dr. Chestine Spore Reason for referral:  Chief Complaint  Patient presents with   Med Management - DVT   HISTORY OF PRESENT ILLNESS: Brandy Cole is a 47 y.o. female with PMH migraine, Graves disease, who presents for follow up medication management after diagnosis of DVT on 09/05/22 s/p orthopedic surgery on her left ankle for a torn tendon on 08/15/22 after which she was not very mobile and her leg was in a cast. Initially seen in DVT Clinic 09/05/22 at which time Eliquis was started with plans to treat provoked DVT for 3 months. Last seen 10/10/22 when pain and swelling had resolved, and plan for 3 months of anticoagulation was continued.   Today patient has now graduated from needing a boot and scooter to ambulate to now only walking with a crutch. She continues to participate in physical therapy twice weekly. Denies abnormal bleeding or bruising. Denies missed doses of Eliquis. She finished her last dose last week. She has refrained from massage therapy while taking anticoagulation for DVT, and her massage therapist needs a letter clearing her to return for massages, which was provided today.   Positive Thrombotic Risk Factors: Recent surgery (within 3 months), Paralysis, paresis, or recent plaster cast immobilization of lower extremity, Bed rest >72 hours within 3 month, Estrogen therapy Bleeding Risk Factors: Anticoagulant therapy  Negative Thrombotic Risk Factors: Previous VTE, Recent trauma (within 3 months), Recent admission to hospital with acute illness (within 3 months), Central venous catheterization, Sedentary journey lasting >8 hours within 4 weeks, Pregnancy, Within 6 weeks postpartum, Recent cesarean section (within 3  months), Testosterone therapy, Erythropoiesis-stimulating agent, Recent COVID diagnosis (within 3 months), Active cancer, Non-malignant, chronic inflammatory condition, Known thrombophilic condition, Smoking, Obesity, Older age  Rx Insurance Coverage: Commercial Rx Affordability: Eliquis and Xarelto are both $0/month.  Preferred Pharmacy: Filled Eliquis starter pack during last visit at [facility]. [facility].   Past Medical History:  Diagnosis Date   FHx: premature coronary heart disease    H/O chlamydia infection    Migraine    Oral contraceptive use 10/19/2013   Thyroid disease    Torn tendon     Past Surgical History:  Procedure Laterality Date   torn tendon     WISDOM TOOTH EXTRACTION      Social History   Socioeconomic History   Marital status: Single    Spouse name: Not on file   Number of children: 0   Years of education: 16   Highest education level: Not on file  Occupational History   Occupation: [employer]  Tobacco Use   Smoking status: Never   Smokeless tobacco: Never  Vaping Use   Vaping status: Never Used  Substance and Sexual Activity   Alcohol use: No   Drug use: No   Sexual activity: Yes    Birth control/protection: Condom  Other Topics Concern   Not on file  Social History Narrative   Right handed   Drinks caffeine   Two story   Social Determinants of Health   Financial Resource Strain: Not on file  Food Insecurity: Not on file  Transportation Needs:  Not on file  Physical Activity: Not on file  Stress: Not on file  Social Connections: Unknown (08/31/2021)   Received [facility]   Social Network    Social Network: Not on file  Intimate Partner Violence: Unknown (07/23/2021)   Received from [facility]   HITS    Physically Hurt: Not on file    Insult or Talk Down To: Not on file    Threaten Physical Harm: Not on file    Scream or Curse: Not on file    Family History   Problem Relation Age of Onset   Heart disease Father    Hyperlipidemia Father    Diabetes Father    Hyperlipidemia Mother     Allergies as of 12/09/2022 - Review Complete 12/09/2022  Allergen Reaction Noted   Methimazole Itching 03/06/2015    Current Outpatient Medications on File Prior to Encounter  Medication Sig Dispense Refill   acetaminophen (TYLENOL) 500 MG tablet Take 500 mg by mouth every 6 (six) hours as needed for mild pain or moderate pain.     b complex vitamins capsule Take 1 capsule by mouth daily.     propylthiouracil (PTU) 50 MG tablet Take 100 mg by mouth 2 (two) times daily.     TURMERIC PO Take 1 capsule by mouth daily.     Vitamin D, Ergocalciferol, (DRISDOL) 1.25 MG (50000 UNIT) CAPS capsule Take 50,000 Units by mouth every 7 (seven) days.     Erenumab-aooe (AIMOVIG) 70 MG/ML SOAJ Inject 70 mg into the skin every 30 (thirty) days. (Patient not taking: Reported on 12/09/2022) 1.12 mL 11   No current facility-administered medications on file prior to encounter.   REVIEW OF SYSTEMS:  Review of Systems  Respiratory:  Negative for shortness of breath.   Cardiovascular:  Negative for chest pain, palpitations and leg swelling.  Musculoskeletal:  Negative for myalgias.  Neurological:  Negative for dizziness and tingling.   PHYSICAL EXAMINATION:  Vitals:   12/09/22 1438  BP: (!) 146/78  Pulse: 85  SpO2: 100%    Physical Exam Vitals reviewed.  Cardiovascular:     Rate and Rhythm: Normal rate.  Pulmonary:     Effort: Pulmonary effort is normal.  Musculoskeletal:        General: No swelling or tenderness.  Skin:    Findings: No bruising or erythema.  Psychiatric:        Mood and Affect: Mood normal.        Behavior: Behavior normal.        Thought Content: Thought content normal.   Villalta Score for Post-Thrombotic Syndrome: Pain: Absent Cramps: Absent Heaviness: Absent Paresthesia: Absent Pruritus: Absent Pretibial Edema: Absent Skin Induration:  Absent Hyperpigmentation: Absent Redness: Absent Venous Ectasia: Absent Pain on calf compression: Absent Villalta Preliminary Score: 0 Is venous ulcer present?: No If venous ulcer is present and score is <15, then 15 points total are assigned: Absent Villalta Total Score: 0  LABS:  CBC     Component Value Date/Time   WBC 7.1 04/05/2022 1003   RBC 4.06 04/05/2022 1003   HGB 12.2 04/05/2022 1003   HCT 36.7 04/05/2022 1003   PLT 606.0 (H) 04/05/2022 1003   MCV 90.5 04/05/2022 1003   MCH 30.6 03/10/2020 1032   MCHC 33.3 04/05/2022 1003   RDW 14.8 04/05/2022 1003   LYMPHSABS 1.7 04/05/2022 1003   MONOABS 0.5 04/05/2022 1003   EOSABS 0.1 04/05/2022 1003   BASOSABS 0.1 04/05/2022 1003    Hepatic Function  Component Value Date/Time   PROT 6.5 04/05/2022 1003   ALBUMIN 3.8 04/05/2022 1003   AST 12 04/05/2022 1003   ALT 9 04/05/2022 1003   ALKPHOS 41 04/05/2022 1003   BILITOT 0.5 04/05/2022 1003    Renal Function   Lab Results  Component Value Date   CREATININE 0.84 04/05/2022   CREATININE 0.85 04/03/2021   CREATININE 0.91 03/10/2020    CrCl cannot be calculated (Patient's most recent lab result is older than the maximum 21 days allowed.).   VVS Vascular Lab Studies:  09/05/22 VAS [place] LOWER EXTREMITY VENOUS LEFT (DVT)  Summary:  RIGHT:  - No evidence of common femoral vein obstruction.   LEFT:  - Findings consistent with acute deep vein thrombosis involving the left  peroneal veins.  - No cystic structure found in the popliteal fossa.   ASSESSMENT: Location of DVT: Left distal vein Cause of DVT: provoked by a transient risk factor - L ankle surgery on 08/15/22 followed by prolonged immobility. She was started on anticoagulation with Eliquis at diagnosis with plans to treat first provoked DVT for 3 months. Her estrogen-containing contraceptive was appropriately discontinued by her gynecologist followed DVT diagnosis. She has now completed 3 months of anticoagulation  with no toxicities. Her mobility has increased, and her symptoms of pain and swelling have resolved.   PLAN: -Patient is discharged from the DVT Clinic. -Discontinue anticoagulation with Eliquis, as patient has completed 3 months of treatment for provoked DVT.  -Counseled patient on future VTE risk reduction strategies and to inform all future providers of DVT history.   Follow up: with PCP as otherwise indicated. No further follow up needed in DVT Clinic.   Pervis Hocking, PharmD, [place], CPP Deep Vein Thrombosis Clinic Clinical Pharmacist Practitioner Office: [phone]

## 2022-12-09 NOTE — Patient Instructions (Signed)
You have been discharged from the DVT Clinic! No further follow up in the DVT Clinic is needed.  -Stop Eliquis  Please reach out if any questions come up. [phone] [facility].

## 2022-12-10 DIAGNOSIS — R2689 Other abnormalities of gait and mobility: Secondary | ICD-10-CM | POA: Diagnosis not present

## 2022-12-10 DIAGNOSIS — M25672 Stiffness of left ankle, not elsewhere classified: Secondary | ICD-10-CM | POA: Diagnosis not present

## 2022-12-12 DIAGNOSIS — R2689 Other abnormalities of gait and mobility: Secondary | ICD-10-CM | POA: Diagnosis not present

## 2022-12-12 DIAGNOSIS — E0590 Thyrotoxicosis, unspecified without thyrotoxic crisis or storm: Secondary | ICD-10-CM | POA: Diagnosis not present

## 2022-12-12 DIAGNOSIS — M25672 Stiffness of left ankle, not elsewhere classified: Secondary | ICD-10-CM | POA: Diagnosis not present

## 2022-12-16 DIAGNOSIS — R2689 Other abnormalities of gait and mobility: Secondary | ICD-10-CM | POA: Diagnosis not present

## 2022-12-16 DIAGNOSIS — M25672 Stiffness of left ankle, not elsewhere classified: Secondary | ICD-10-CM | POA: Diagnosis not present

## 2022-12-19 DIAGNOSIS — R2689 Other abnormalities of gait and mobility: Secondary | ICD-10-CM | POA: Diagnosis not present

## 2022-12-19 DIAGNOSIS — M25672 Stiffness of left ankle, not elsewhere classified: Secondary | ICD-10-CM | POA: Diagnosis not present

## 2022-12-24 DIAGNOSIS — M25672 Stiffness of left ankle, not elsewhere classified: Secondary | ICD-10-CM | POA: Diagnosis not present

## 2022-12-24 DIAGNOSIS — R2689 Other abnormalities of gait and mobility: Secondary | ICD-10-CM | POA: Diagnosis not present

## 2022-12-27 ENCOUNTER — Other Ambulatory Visit: Payer: Self-pay | Admitting: Obstetrics and Gynecology

## 2022-12-27 DIAGNOSIS — D235 Other benign neoplasm of skin of trunk: Secondary | ICD-10-CM | POA: Diagnosis not present

## 2022-12-27 DIAGNOSIS — L821 Other seborrheic keratosis: Secondary | ICD-10-CM | POA: Diagnosis not present

## 2022-12-27 DIAGNOSIS — L918 Other hypertrophic disorders of the skin: Secondary | ICD-10-CM | POA: Diagnosis not present

## 2022-12-30 DIAGNOSIS — R2689 Other abnormalities of gait and mobility: Secondary | ICD-10-CM | POA: Diagnosis not present

## 2022-12-30 DIAGNOSIS — M25672 Stiffness of left ankle, not elsewhere classified: Secondary | ICD-10-CM | POA: Diagnosis not present

## 2023-01-02 DIAGNOSIS — M25672 Stiffness of left ankle, not elsewhere classified: Secondary | ICD-10-CM | POA: Diagnosis not present

## 2023-01-02 DIAGNOSIS — R2689 Other abnormalities of gait and mobility: Secondary | ICD-10-CM | POA: Diagnosis not present

## 2023-01-02 LAB — DERMATOLOGY PATHOLOGY

## 2023-01-06 ENCOUNTER — Telehealth: Payer: Self-pay | Admitting: Neurology

## 2023-01-06 DIAGNOSIS — M25672 Stiffness of left ankle, not elsewhere classified: Secondary | ICD-10-CM | POA: Diagnosis not present

## 2023-01-06 DIAGNOSIS — R2689 Other abnormalities of gait and mobility: Secondary | ICD-10-CM | POA: Diagnosis not present

## 2023-01-06 NOTE — Telephone Encounter (Signed)
Pt called in stating the Aimovig was $900 and the pharmacy told her they needed prior authorization.

## 2023-01-08 ENCOUNTER — Ambulatory Visit: Payer: BC Managed Care – PPO | Admitting: Neurology

## 2023-01-08 ENCOUNTER — Other Ambulatory Visit (HOSPITAL_COMMUNITY): Payer: Self-pay

## 2023-01-08 ENCOUNTER — Telehealth: Payer: Self-pay | Admitting: Pharmacy Technician

## 2023-01-08 NOTE — Telephone Encounter (Signed)
PA request has been Submitted. New Encounter created for follow up. For additional info see Pharmacy Prior Auth telephone encounter from 01/08/2023.

## 2023-01-08 NOTE — Telephone Encounter (Signed)
Pharmacy Patient Advocate Encounter   Received notification from Pt Calls Messages that prior authorization for Aimovig 70MG /ML auto-injectors is required/requested.   Insurance verification completed.   The patient is insured through [facility] .   Per test claim: PA required; PA started via CoverMyMeds. KEY [id number] . Waiting for clinical questions to populate.

## 2023-01-09 DIAGNOSIS — R2689 Other abnormalities of gait and mobility: Secondary | ICD-10-CM | POA: Diagnosis not present

## 2023-01-09 DIAGNOSIS — M25672 Stiffness of left ankle, not elsewhere classified: Secondary | ICD-10-CM | POA: Diagnosis not present

## 2023-01-13 ENCOUNTER — Ambulatory Visit: Payer: BC Managed Care – PPO | Admitting: Neurology

## 2023-01-13 DIAGNOSIS — R2689 Other abnormalities of gait and mobility: Secondary | ICD-10-CM | POA: Diagnosis not present

## 2023-01-13 DIAGNOSIS — M25672 Stiffness of left ankle, not elsewhere classified: Secondary | ICD-10-CM | POA: Diagnosis not present

## 2023-01-13 NOTE — Telephone Encounter (Signed)
Patient is calling about the PA that has been sent in

## 2023-01-14 NOTE — Telephone Encounter (Signed)
Advised patient we will send a status message to the Prior Auth team to check the status of the clinical questions.

## 2023-01-16 DIAGNOSIS — M25672 Stiffness of left ankle, not elsewhere classified: Secondary | ICD-10-CM | POA: Diagnosis not present

## 2023-01-16 DIAGNOSIS — R2689 Other abnormalities of gait and mobility: Secondary | ICD-10-CM | POA: Diagnosis not present

## 2023-01-20 ENCOUNTER — Other Ambulatory Visit (HOSPITAL_COMMUNITY): Payer: Self-pay

## 2023-01-20 NOTE — Telephone Encounter (Signed)
Pharmacy Patient Advocate Encounter   Received notification from Pt Calls Messages that prior authorization for Aimovig 70MG /ML auto-injectors is required/requested.   Insurance verification completed.   The patient is insured through [facility] .   Per test claim: PA required; PA submitted to [employer] via CoverMyMeds Key/confirmation #/EOC [id number] Status is pending  *Please note that this is a resubmission as questions on previous request expired.

## 2023-01-21 NOTE — Telephone Encounter (Signed)
Pharmacy Patient Advocate Encounter  Questions generated, answered, and submitted

## 2023-01-27 DIAGNOSIS — H02882 Meibomian gland dysfunction right lower eyelid: Secondary | ICD-10-CM | POA: Diagnosis not present

## 2023-01-27 DIAGNOSIS — H16223 Keratoconjunctivitis sicca, not specified as Sjogren's, bilateral: Secondary | ICD-10-CM | POA: Diagnosis not present

## 2023-01-27 DIAGNOSIS — H02885 Meibomian gland dysfunction left lower eyelid: Secondary | ICD-10-CM | POA: Diagnosis not present

## 2023-01-30 DIAGNOSIS — R2689 Other abnormalities of gait and mobility: Secondary | ICD-10-CM | POA: Diagnosis not present

## 2023-01-30 DIAGNOSIS — M25672 Stiffness of left ankle, not elsewhere classified: Secondary | ICD-10-CM | POA: Diagnosis not present

## 2023-01-31 NOTE — Telephone Encounter (Signed)
Pharmacy Patient Advocate Encounter  Received notification from [facility] that Prior Authorization for AIMOVIG 70MG  has been DENIED.  Full denial letter will be uploaded to the media tab. See denial reason below.   PA #/Case ID/Reference #: [id number]

## 2023-02-04 MED ORDER — NURTEC 75 MG PO TBDP: 75.0000 mg | ORAL_TABLET | ORAL | 3 refills | Status: DC

## 2023-02-04 NOTE — Telephone Encounter (Signed)
Please let pt know Aimovig was denied.  We can try Nurtec 75mg  every other day.  This is similar to aimovig, but oral tablet.

## 2023-02-04 NOTE — Telephone Encounter (Signed)
Called and spoke to patient and informed her of Aimovig denial. Patient stated that she would like to give Nurtec a try and is aware we will get that sent in. Patient states she has new insurance and will make sure that the new card is uploaded. Patient will let Dr. Allena Katz know how she does on Nurtec.

## 2023-02-04 NOTE — Telephone Encounter (Signed)
Sent!

## 2023-02-06 DIAGNOSIS — R2689 Other abnormalities of gait and mobility: Secondary | ICD-10-CM | POA: Diagnosis not present

## 2023-02-06 DIAGNOSIS — M25672 Stiffness of left ankle, not elsewhere classified: Secondary | ICD-10-CM | POA: Diagnosis not present

## 2023-03-07 ENCOUNTER — Telehealth: Payer: Self-pay | Admitting: Neurology

## 2023-03-07 MED ORDER — NURTEC 75 MG PO TBDP: 75.0000 mg | ORAL_TABLET | ORAL | 3 refills | Status: DC

## 2023-03-07 NOTE — Telephone Encounter (Signed)
Patient siadh she has been calling for 4 months trying to get this medication. Since ins wouldn't pay for aimovig, patel switched her Nurtec. Ins wont pay for 16 pills, they need RX to say 18 pills. She said she called 10/29 around 4pm and spoke with someone or was sent back to VM and she has not heard back from anyone. You are requesting a call back today.   [facility]

## 2023-03-07 NOTE — Telephone Encounter (Signed)
I do not see any calls either.  Rx updated and sent.

## 2023-03-07 NOTE — Telephone Encounter (Signed)
Called patient and informed her that Rx has been updated and sent to her pharmacy.

## 2023-03-14 ENCOUNTER — Telehealth: Payer: Self-pay

## 2023-03-14 ENCOUNTER — Other Ambulatory Visit (HOSPITAL_COMMUNITY): Payer: Self-pay

## 2023-03-14 NOTE — Telephone Encounter (Signed)
Pharmacy Patient Advocate Encounter   Received notification from CoverMyMeds that prior authorization for Nurtec 75MG  dispersible tablets is required/requested.   Insurance verification completed.   The patient is insured through [employer] .   Prior Authorization form/request asks a question that requires your assistance. Please see the question below and advise accordingly.   The covered alternative is Emgality. If your patient has not tried this drug, please provide details why your patient can't try the alternative.

## 2023-03-14 NOTE — Telephone Encounter (Signed)
ERROR

## 2023-03-17 MED ORDER — EMGALITY 120 MG/ML ~~LOC~~ SOAJ: 240.0000 mg | Freq: Once | SUBCUTANEOUS | 0 refills | Status: AC

## 2023-03-17 NOTE — Telephone Encounter (Signed)
Called  patient and informed her that  her insurance is denying Nurtec.  Emgality is preferred.  Dr. Allena Katz will send Rx for [facility].  Start with loading dose injection, then she will need to contact us so I can send maintenance dose.     Patient verbalized understanding and will contact us once she picks up the loading dose.

## 2023-03-17 NOTE — Telephone Encounter (Signed)
Please let pt know her insurance is denying Nurtec.  Emgality is preferred.  I will send Rx for Emgality.  Start with loading dose injection, then she will need to contact us so I can send maintenance dose.

## 2023-04-03 ENCOUNTER — Other Ambulatory Visit (HOSPITAL_COMMUNITY): Payer: Self-pay

## 2023-04-11 ENCOUNTER — Encounter: Payer: Self-pay | Admitting: Family Medicine

## 2023-04-11 ENCOUNTER — Ambulatory Visit (INDEPENDENT_AMBULATORY_CARE_PROVIDER_SITE_OTHER): Payer: Commercial Managed Care - HMO | Admitting: Family Medicine

## 2023-04-11 VITALS — BP 138/84 | HR 77 | Temp 98.3°F | Ht 64.0 in | Wt 194.2 lb

## 2023-04-11 DIAGNOSIS — Z0001 Encounter for general adult medical examination with abnormal findings: Secondary | ICD-10-CM | POA: Diagnosis not present

## 2023-04-11 DIAGNOSIS — R7303 Prediabetes: Secondary | ICD-10-CM | POA: Diagnosis not present

## 2023-04-11 DIAGNOSIS — Z8249 Family history of ischemic heart disease and other diseases of the circulatory system: Secondary | ICD-10-CM

## 2023-04-11 DIAGNOSIS — G43709 Chronic migraine without aura, not intractable, without status migrainosus: Secondary | ICD-10-CM | POA: Diagnosis not present

## 2023-04-11 DIAGNOSIS — I82452 Acute embolism and thrombosis of left peroneal vein: Secondary | ICD-10-CM

## 2023-04-11 DIAGNOSIS — R030 Elevated blood-pressure reading, without diagnosis of hypertension: Secondary | ICD-10-CM

## 2023-04-11 DIAGNOSIS — E042 Nontoxic multinodular goiter: Secondary | ICD-10-CM

## 2023-04-11 DIAGNOSIS — E0500 Thyrotoxicosis with diffuse goiter without thyrotoxic crisis or storm: Secondary | ICD-10-CM

## 2023-04-11 DIAGNOSIS — E669 Obesity, unspecified: Secondary | ICD-10-CM

## 2023-04-11 DIAGNOSIS — Z6833 Body mass index (BMI) 33.0-33.9, adult: Secondary | ICD-10-CM

## 2023-04-11 LAB — CBC WITH DIFFERENTIAL/PLATELET
Basophils Absolute: 0 10*3/uL (ref 0.0–0.1)
Basophils Relative: 0.6 % (ref 0.0–3.0)
Eosinophils Absolute: 0.1 10*3/uL (ref 0.0–0.7)
Eosinophils Relative: 2.4 % (ref 0.0–5.0)
HCT: 36.6 % (ref 36.0–46.0)
Hemoglobin: 12.1 g/dL (ref 12.0–15.0)
Lymphocytes Relative: 25.9 % (ref 12.0–46.0)
Lymphs Abs: 1.5 10*3/uL (ref 0.7–4.0)
MCHC: 33 g/dL (ref 30.0–36.0)
MCV: 91.8 fL (ref 78.0–100.0)
Monocytes Absolute: 0.4 10*3/uL (ref 0.1–1.0)
Monocytes Relative: 6.6 % (ref 3.0–12.0)
Neutro Abs: 3.8 10*3/uL (ref 1.4–7.7)
Neutrophils Relative %: 64.5 % (ref 43.0–77.0)
Platelets: 456 10*3/uL — ABNORMAL HIGH (ref 150.0–400.0)
RBC: 3.99 Mil/uL (ref 3.87–5.11)
RDW: 15 % (ref 11.5–15.5)
WBC: 5.8 10*3/uL (ref 4.0–10.5)

## 2023-04-11 LAB — LIPID PANEL
Cholesterol: 245 mg/dL — ABNORMAL HIGH (ref 0–200)
HDL: 46.2 mg/dL (ref >39.00)
LDL Cholesterol: 184 mg/dL — ABNORMAL HIGH (ref 0–99)
NonHDL: 198.6
Total CHOL/HDL Ratio: 5
Triglycerides: 75 mg/dL (ref 0.0–149.0)
VLDL: 15 mg/dL (ref 0.0–40.0)

## 2023-04-11 LAB — COMPREHENSIVE METABOLIC PANEL
ALT: 21 U/L (ref 0–35)
AST: 15 U/L (ref 0–37)
Albumin: 4 g/dL (ref 3.5–5.2)
Alkaline Phosphatase: 78 U/L (ref 39–117)
BUN: 10 mg/dL (ref 6–23)
CO2: 26 meq/L (ref 19–32)
Calcium: 9 mg/dL (ref 8.4–10.5)
Chloride: 104 meq/L (ref 96–112)
Creatinine, Ser: 0.9 mg/dL (ref 0.40–1.20)
GFR: 75.31 mL/min (ref >60.00)
Glucose, Bld: 105 mg/dL — ABNORMAL HIGH (ref 70–99)
Potassium: 4 meq/L (ref 3.5–5.1)
Sodium: 138 meq/L (ref 135–145)
Total Bilirubin: 0.6 mg/dL (ref 0.2–1.2)
Total Protein: 7.4 g/dL (ref 6.0–8.3)

## 2023-04-11 LAB — HEMOGLOBIN A1C: Hgb A1c MFr Bld: 6.2 % (ref 4.6–6.5)

## 2023-04-11 NOTE — Progress Notes (Signed)
Subjective  Chief Complaint  Patient presents with   Annual Exam    Pt here for Annual Exam and is currently fasting    Graves' Disease    HPI: Brandy Cole is a 47 y.o. female who presents to [facility] [place] today for a Female Wellness Visit.  She also has the concerns and/or needs as listed above in the chief complaint. These will be addressed in addition to the Health Maintenance Visit.   Wellness Visit: annual visit with health maintenance review and exam HM: sees gyn annually. Last pap reported 05/2021 and last mammo 05/2022. Colonoscopy current. Declines flu vaccine. Perimenopausal and with regular cycles. No longer can take estrogens due to dvt, using coitus interruptus for bc. Understands low risk of pregnancy due to age but it is possible. Now working at [employer], Web designer.  Chronic disease management visit and/or acute problem visit: Discussed the use of AI scribe software for clinical note transcription with the patient, who gave verbal consent to proceed.  History of Present Illness   The patient, with a past medical history of surgery, postoperative DVT, and previous use of birth control,(I reviewed surgical and records regarding surgery and postop DVT)  presents with leg pain for about a month. The pain is described as an ache, constant, and located behind the knee and up the thigh. The pain is not associated with swelling and does not worsen with certain activities. The patient has not sought any treatment for this pain yet.  In addition, the patient has a history of migraines and is in the process of changing medication due to insurance coverage issues. The patient has not received the new medication yet.  I have reviewed neurology notes.  They recommend starting Emgality.  Patient is taking Excedrin daily.  The patient also discusses weight management. Her diet has improved. Little exercise due to postoperative foot surgery.  Has failed phentermine and qsymia in the past.  The patient is also perimenopausal and has regular periods.   She follows with endocrinology for Graves' disease and then stable on her current medications.  I reviewed notes  I updated her problem list  Assessment  1. Encounter for well adult exam with abnormal findings   2. Graves disease   3. Multiple thyroid nodules   4. Chronic migraine without aura without status migrainosus, not intractable   5. Prediabetes   6. FH: premature coronary heart disease   7. Acute deep vein thrombosis (DVT) of left peroneal vein (HCC)   8. Prehypertension   9. Obesity (BMI 30-39.9)      Plan  Female Wellness Visit: Age appropriate Health Maintenance and Prevention measures were discussed with patient. Included topics are cancer screening recommendations, ways to keep healthy (see AVS) including dietary and exercise recommendations, regular eye and dental care, use of seat belts, and avoidance of moderate alcohol use and tobacco use.  Screens are current BMI: discussed patient's BMI and encouraged positive lifestyle modifications to help get to or maintain a target BMI. HM needs and immunizations were addressed and ordered. See below for orders. See HM and immunization section for updates. Routine labs and screening tests ordered including cmp, cbc and lipids where appropriate. Discussed recommendations regarding Vit D and calcium supplementation (see AVS)  Chronic disease f/u and/or acute problem visit: (deemed necessary to be done in addition to the wellness visit): Assessment and Plan    Postoperative Leg Pain Persistent mild thigh pain for one  month, likely due to tight hamstrings from prolonged immobility post-surgery. Differential includes muscle tightness versus other musculoskeletal issues. Physical therapy evaluation recommended. -Exam is consistent with hamstring tendon tenderness likely due to tightness.  No clinical signs of DVT. - Discuss  pain with physical therapist - Monitor for worsening pain or swelling and report if these occur, will get Dopplers if pain worsens  Migraine Chronic migraines managed with daily Excedrin. Nurtec not covered by insurance. Neurology recommended Emgality; patient has not received it yet. Importance of contacting neurology for loading dose and monthly injections discussed. Will check for Nurtec samples in the office. - Contact neurology for Emgality loading dose and monthly injections  Weight Management Difficulty with weight management, currently almost 200 pounds. Discussed GLP-1 agonists like Zepbound or Wegovy, including side effects (fatigue, nausea, constipation, loose stools). Insurance coverage and cost are concerns. Alternative medications like Contrave discussed. Recommended strength training and dietary changes, emphasizing routine changes every 4-6 weeks to prevent metabolic adaptation. - Check insurance coverage for Zepbound or [phone] - Consider alternative medications like Contrave if GLP-1 agonists are not covered, but GLP-1 agonist would be great for her.  Discussed expectations. - Recommend strength training and dietary changes - Advise changing exercise and diet routine every 4-6 weeks  Multinodular goiter and Graves' disease per endocrinology.  Stable  General Health Maintenance Routine health maintenance discussed. Last mammogram in February 2023, last Pap smear in 2023. Advised to schedule mammogram in February 2024 and continue regular GYN visits for annual exams. - Schedule mammogram in February 2024 - Continue regular GYN visits for annual exams  Follow-up 12 months for complete physical - Order blood work - Follow up with neurologist for migraine management - Follow up with physical therapist for leg pain - Schedule GYN visit in February for mammogram.      Orders Placed This Encounter  Procedures   CBC with Differential/Platelet   Comprehensive metabolic panel    Lipid panel   Hemoglobin A1c   No orders of the defined types were placed in this encounter.      Body mass index is 33.33 kg/m. Wt Readings from Last 3 Encounters:  04/11/23 194 lb 3.2 oz (88.1 kg)  10/01/22 180 lb (81.6 kg)  04/05/22 193 lb 3.2 oz (87.6 kg)    Patient Active Problem List   Diagnosis Date Noted Date Diagnosed   Acute deep vein thrombosis (DVT) of left peroneal vein (HCC) 09/05/2022     Patient reports she had surgery on her ankle for a torn tendon on 08/15/22. She was started on aspirin 325 mg daily for DVT prevention post surgery. Postoperative DVT while on COCs. Estrogen was stopped. 3 months of eloquis for treatment.     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 No apnea    Migraine 10/25/2010     Overview:  Neurology - in [facility]; nl CT scan in past. Neurology dr.patel managing    Health Maintenance  Topic Date Due   Cervical Cancer Screening (HPV/Pap Cotest)  05/21/2022   MAMMOGRAM  06/14/2022   COVID-19 Vaccine (5 - 2024-25 season) 04/27/2023 (Originally 12/22/2022)   DTaP/Tdap/Td (  2 - Td or Tdap) 10/20/2023   Colonoscopy  09/09/2030   Hepatitis C Screening  Completed   HIV Screening  Completed   HPV VACCINES  Aged Out   INFLUENZA VACCINE  Discontinued   Immunization History  Administered Date(s) Administered   Hepatitis A, Ped/Adol-2 Dose 04/29/2014   Moderna Covid-19 Vaccine Bivalent Booster 54yrs & up 03/04/2021   PFIZER(Purple Top)SARS-COV-2 Vaccination 08/11/2019, 09/01/2019, 04/06/2020   Tdap 10/19/2013   We updated and reviewed the patient's past history in detail and it is documented below. Allergies: Patient  reports  no history of alcohol use. Past Medical History Patient  has a past medical history of FHx: premature coronary heart disease, H/O chlamydia infection, Migraine, Oral contraceptive use (10/19/2013), Thyroid disease, and Torn tendon. Past Surgical History Patient  has a past surgical history that includes Wisdom tooth extraction and torn tendon. Social History   Socioeconomic History   Marital status: Single    Spouse name: Not on file   Number of children: 0   Years of education: 16   Highest education level: Not on file  Occupational History   Occupation: [employer]  Tobacco Use   Smoking status: Never   Smokeless tobacco: Never  Vaping Use   Vaping status: Never Used  Substance and Sexual Activity   Alcohol use: No   Drug use: No   Sexual activity: Yes    Birth control/protection: Condom  Other Topics Concern   Not on file  Social History Narrative   Right handed   Drinks caffeine   Two story   Social Drivers of Health   Financial Resource Strain: Not on file  Food Insecurity: Not on file  Transportation Needs: Not on file  Physical Activity: Not on file  Stress: Not on file  Social Connections: Unknown (08/31/2021)   [facility]   Social Network    Social Network: Not on file   Family History  Problem Relation Age of Onset   Heart disease Father    Hyperlipidemia Father    Diabetes Father    Hyperlipidemia Mother     Review of Systems: Constitutional: negative for fever or malaise Ophthalmic: negative for photophobia, double vision or loss of vision Cardiovascular: negative for chest pain, dyspnea on exertion, or new LE swelling Respiratory: negative for SOB or persistent cough Gastrointestinal: negative for abdominal pain, change in bowel habits or melena Genitourinary: negative for dysuria or gross hematuria, no abnormal uterine bleeding or disharge Musculoskeletal: negative for new gait disturbance or  muscular weakness Integumentary: negative for new or persistent rashes, no breast lumps Neurological: negative for TIA or stroke symptoms Psychiatric: negative for SI or delusions Allergic/Immunologic: negative for hives  Patient Care Team    Relationship Specialty Notifications Start End  Willow Ora, MD PCP - General Family Medicine  10/31/17   Haygood, Maris Berger, MD (Inactive) Consulting Physician Obstetrics and Gynecology  11/13/17   Berdine Dance, NP Nurse Practitioner Bariatrics  11/13/17   Daiva Nakayama, MD Referring Physician Internal Medicine  11/13/17   Gelene Mink, OD Referring Physician Optometry  03/06/18   Glendale Chard, DO Consulting Physician Neurology  04/03/21    Comment: migraines and abnl UE mvts,    Objective  Vitals: BP 138/84   Pulse 77   Temp 98.3 F (36.8 C)   Ht 5\' 4"  (1.626 m)   Wt 194 lb 3.2 oz (88.1 kg)   SpO2 98%   BMI 33.33 kg/m  General:  Well developed, well nourished, no acute distress  Psych:  Alert and orientedx3,normal mood and affect HEENT:  Normocephalic, atraumatic, non-icteric sclera, PERRL, supple neck without adenopathy, mass, positive stable thyromegaly Cardiovascular:  Normal S1, S2, RRR without gallop, rub or murmur Respiratory:  Good breath sounds bilaterally, CTAB with normal respiratory effort Gastrointestinal: normal bowel sounds, soft, non-tender, no noted masses. No HSM MSK: no deformities, contusions. Joints are without erythema or swelling.  Tender over distal right hamstring without nodules or masses.  Full range of motion.  No lower extremity swelling or cords Skin:  Warm, no rashes or suspicious lesions noted Neurologic:    Mental status is normal. Gross motor and sensory exams are normal. Normal gait. No tremor    Commons side effects, risks, benefits, and alternatives for medications and treatment plan prescribed today were discussed, and the patient expressed understanding of the given instructions. Patient is  instructed to call or message via MyChart if he/she has any questions or concerns regarding our treatment plan. No barriers to understanding were identified. We discussed Red Flag symptoms and signs in detail. Patient expressed understanding regarding what to do in case of urgent or emergency type symptoms.  Medication list was reconciled, printed and provided to the patient in AVS. Patient instructions and summary information was reviewed with the patient as documented in the AVS. This note was prepared with assistance of Dragon voice recognition software. Occasional wrong-word or sound-a-like substitutions may have occurred due to the inherent limitations of voice recognition software .

## 2023-04-11 NOTE — Patient Instructions (Signed)
Please return in 12 months for your annual complete physical; please come fasting.   Let me know if your insurance company covers [place] or Zepbound for weight loss.   I will release your lab results to you on your MyChart account with further instructions. You may see the results before I do, but when I review them I will send you a message with my report or have my assistant call you if things need to be discussed. Please reply to my message with any questions. Thank you!   If you have any questions or concerns, please don't hesitate to send me a message via MyChart or call the office at [phone]. Thank you for visiting with Brandy Cole today! It's our pleasure caring for you.   VISIT SUMMARY:  During today's visit, we discussed your persistent leg pain, migraine management, weight management, and general health maintenance. We reviewed your symptoms, discussed potential causes, and outlined a plan for each issue to help you manage your health effectively.  YOUR PLAN:  -Left LEG PAIN: Your leg pain is likely due to tight hamstrings from prolonged gait changes after your surgery. We recommend a physical therapy evaluation to help manage this pain. Please monitor for any worsening pain or swelling and report these symptoms if they occur.  -MIGRAINE: Chronic migraines are being managed with daily Excedrin, but we are exploring other medication options due to insurance coverage issues. We will check for Nurtec samples in the office and you should contact neurology for Emgality loading dose and monthly injections.  -WEIGHT MANAGEMENT: We discussed your challenges with weight management and potential medication options like GLP-1 agonists (Zepbound or Wegovy) and Contrave. We also recommend incorporating strength training and dietary changes, and changing your exercise and diet routine every 4-6 weeks to prevent metabolic adaptation.  -GENERAL HEALTH MAINTENANCE: Routine health maintenance is important.  Your last mammogram was in February 2024, and your last Pap smear was in 2023. Please schedule your next mammogram in February 2024 and continue with regular GYN visits for annual exams.  INSTRUCTIONS:  Please follow up with the neurologist for migraine management and the physical therapist for your leg pain. Schedule a GYN visit in February for your mammogram. Additionally, we will order blood work to monitor your overall health.

## 2023-04-14 NOTE — Progress Notes (Signed)
Labs reviewed.  The 10-year ASCVD risk score (Arnett DK, et al., 2019) is: 3.7%   Values used to calculate the score:     Age: 47 years     Sex: Female     Is Non-Hispanic African American: Yes     Diabetic: No     Tobacco smoker: No     Systolic Blood Pressure: 138 mmHg     Is BP treated: No     HDL Cholesterol: 46.2 mg/dL     Total Cholesterol: 245 mg/dL  Dear Brandy Cole, Thank you for allowing me to care for you at your recent office visit.  I wanted to let you know that I have reviewed your lab test results. Your sugar remains elevated in the prediabetic range and your cholesterol is much higher. Improving your diet is needed; we can consider starting cholesterol lowering medication as well. Your other lab results all look good.   Happy holidays.  Sincerely, Dr. Mardelle Matte

## 2023-04-21 ENCOUNTER — Encounter: Payer: Self-pay | Admitting: Family Medicine

## 2023-04-22 ENCOUNTER — Encounter: Payer: Self-pay | Admitting: Obstetrics and Gynecology

## 2023-06-09 ENCOUNTER — Encounter: Payer: Self-pay | Admitting: Family Medicine

## 2023-06-11 MED ORDER — ZEPBOUND 2.5 MG/0.5ML ~~LOC~~ SOAJ: 2.5000 mg | SUBCUTANEOUS | 2 refills | Status: DC

## 2023-06-11 NOTE — Addendum Note (Signed)
Addended by: Asencion Partridge on: 06/11/2023 10:23 AM   Modules accepted: Orders

## 2023-06-13 ENCOUNTER — Telehealth: Payer: Self-pay

## 2023-06-13 NOTE — Telephone Encounter (Signed)
Pharmacy Patient Advocate Encounter   Received notification from  [facility] that prior authorization for Zepbound 2.5MG /0.5ML pen-injectors is required/requested.   Insurance verification completed.   The patient is insured through [facility] .   Per test claim: PA required; PA submitted to above mentioned insurance via CoverMyMeds Key/confirmation #/EOC [id number] Status is pending

## 2023-06-16 ENCOUNTER — Other Ambulatory Visit (HOSPITAL_COMMUNITY): Payer: Self-pay

## 2023-06-16 NOTE — Telephone Encounter (Signed)
 Pharmacy Patient Advocate Encounter  Received notification from [facility] that Prior Authorization for Zepbound 2.5MG /0.5ML pen-injectors has been APPROVED from 06/13/23 to 10/17/23. Spoke to pharmacy to process.Copay is $4.    PA #/Case ID/Reference #: [id number]

## 2023-07-09 NOTE — Telephone Encounter (Signed)
 Please see patient message regarding recent Rx sent to pharmacy and advise

## 2023-07-15 DIAGNOSIS — E0590 Thyrotoxicosis, unspecified without thyrotoxic crisis or storm: Secondary | ICD-10-CM | POA: Diagnosis not present

## 2023-08-01 MED ORDER — ZEPBOUND 5 MG/0.5ML ~~LOC~~ SOAJ: 5.0000 mg | SUBCUTANEOUS | 2 refills | Status: DC

## 2023-08-01 NOTE — Addendum Note (Signed)
 Addended by: Asencion Partridge on: 08/01/2023 08:51 AM   Modules accepted: Orders

## 2023-10-13 ENCOUNTER — Ambulatory Visit: Payer: Self-pay | Admitting: Family Medicine

## 2023-10-15 ENCOUNTER — Encounter: Payer: Self-pay | Admitting: Family Medicine

## 2023-10-15 ENCOUNTER — Ambulatory Visit (INDEPENDENT_AMBULATORY_CARE_PROVIDER_SITE_OTHER): Admitting: Family Medicine

## 2023-10-15 VITALS — BP 138/82 | HR 88 | Temp 97.1°F | Ht 64.0 in | Wt 171.8 lb

## 2023-10-15 DIAGNOSIS — E7800 Pure hypercholesterolemia, unspecified: Secondary | ICD-10-CM | POA: Diagnosis not present

## 2023-10-15 DIAGNOSIS — E0500 Thyrotoxicosis with diffuse goiter without thyrotoxic crisis or storm: Secondary | ICD-10-CM

## 2023-10-15 DIAGNOSIS — E669 Obesity, unspecified: Secondary | ICD-10-CM | POA: Diagnosis not present

## 2023-10-15 DIAGNOSIS — R030 Elevated blood-pressure reading, without diagnosis of hypertension: Secondary | ICD-10-CM

## 2023-10-15 DIAGNOSIS — R7303 Prediabetes: Secondary | ICD-10-CM | POA: Diagnosis not present

## 2023-10-15 LAB — COMPREHENSIVE METABOLIC PANEL WITH GFR
ALT: 13 U/L (ref 0–35)
AST: 14 U/L (ref 0–37)
Albumin: 4.2 g/dL (ref 3.5–5.2)
Alkaline Phosphatase: 62 U/L (ref 39–117)
BUN: 8 mg/dL (ref 6–23)
CO2: 26 meq/L (ref 19–32)
Calcium: 9.3 mg/dL (ref 8.4–10.5)
Chloride: 103 meq/L (ref 96–112)
Creatinine, Ser: 0.9 mg/dL (ref 0.40–1.20)
GFR: 75.04 mL/min (ref >60.00)
Glucose, Bld: 102 mg/dL — ABNORMAL HIGH (ref 70–99)
Potassium: 3.9 meq/L (ref 3.5–5.1)
Sodium: 137 meq/L (ref 135–145)
Total Bilirubin: 1.1 mg/dL (ref 0.2–1.2)
Total Protein: 6.8 g/dL (ref 6.0–8.3)

## 2023-10-15 LAB — LIPID PANEL
Cholesterol: 257 mg/dL — ABNORMAL HIGH (ref 0–200)
HDL: 37.3 mg/dL — ABNORMAL LOW (ref >39.00)
LDL Cholesterol: 199 mg/dL — ABNORMAL HIGH (ref 0–99)
NonHDL: 219.63
Total CHOL/HDL Ratio: 7
Triglycerides: 104 mg/dL (ref 0.0–149.0)
VLDL: 20.8 mg/dL (ref 0.0–40.0)

## 2023-10-15 LAB — TSH: TSH: 1.89 u[IU]/mL (ref 0.35–5.50)

## 2023-10-15 LAB — HEMOGLOBIN A1C: Hgb A1c MFr Bld: 5.6 % (ref 4.6–6.5)

## 2023-10-15 LAB — T4, FREE: Free T4: 0.83 ng/dL (ref 0.60–1.60)

## 2023-10-15 MED ORDER — ZEPBOUND 5 MG/0.5ML ~~LOC~~ SOAJ: 5.0000 mg | SUBCUTANEOUS | 3 refills | Status: AC

## 2023-10-15 NOTE — Progress Notes (Signed)
 Subjective  CC:  Chief Complaint  Patient presents with   Obesity    HPI: Brandy Cole is a 48 y.o. female who presents to the office today to address the problems listed above in the chief complaint. Discussed the use of AI scribe software for clinical note transcription with the patient, who gave verbal consent to proceed.  History of Present Illness Brandy Cole is a 48 year old female who presents for follow-up on weight management with Zepbound .  She has been on Zepbound  since February and has experienced a weight loss of approximately 23 pounds, with her weight decreasing from 194 pounds in December to 171 pounds currently. She is on a 5 mg dose of Zepbound  and is considering an increase to 7.5 mg. She does not weigh herself regularly but notes eating less and likely continues to lose weight. She is satisfied with her current weight loss and is contemplating future goals.  She has a history of prehypertension, prediabetes, and hyperlipidemia. There is a family history of high blood pressure. She was previously considering starting cholesterol medication due to elevated cholesterol levels.  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 She is currently on [employer]. She engages in weight training at home to maintain muscle strength and focuses on good nutrition, particularly protein intake.  She requests a thyroid  function test as her endocrinologist did not receive the results from a previous test conducted in December.   Assessment  1. Obesity (BMI 30-39.9)   2. Prediabetes   3. Graves disease   4. Prehypertension   5. Hypercholesterolemia      Plan  Assessment and Plan Assessment & Plan Obesity Obesity with significant weight loss, now overweight with BMI 29. Satisfied with current weight and medication. Emphasized muscle mass maintenance and protein intake. Current Zepbound  dose effective for  maintenance. - Continue Zepbound  at 5 mg weekly. - Monitor weight bi-weekly. - Consider increasing Zepbound  to 7.5 mg for further weight loss. - Encourage weight training. - Ensure adequate protein intake.  Prehypertension Prehypertension likely familial. Discussed familial risk and need for regular monitoring. - Discuss familial risk of hypertension. - will monitor  Prediabetes Prediabetes with expected improvement in glucose levels due to weight loss and medication. - Order A1c. Expect it to be better now with mounjaro and improved diet  Hyperlipidemia Hyperlipidemia with expected improvement in cholesterol levels due to weight loss and medication. - Order cholesterol test.  Thyroid  function assessment Thyroid  function tests requested due to unavailable previous results. Results to be sent to Dr. Margrette. - Order thyroid  function tests. Clinically stable.   Follow-up Ongoing monitoring and future appointments planned. Blood tests to assess health status. - Send to lab for blood tests. - Schedule follow-up appointment in December for physical examination. - schedule overdue mammogram    Follow up: dec for cpe Orders Placed This Encounter  Procedures   TSH   T4, free   T3   Lipid panel   Comprehensive metabolic panel with GFR   Hemoglobin A1c   Meds ordered this encounter  Medications   tirzepatide  (ZEPBOUND ) 5 MG/0.5ML Pen    Sig: Inject 5 mg into the skin once a week.    Dispense:  6 mL    Refill:  3     I reviewed the patients updated PMH, FH, and SocHx.  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   b complex vitamins capsule Take 1 capsule by mouth daily.   propylthiouracil (PTU) 50 MG tablet Take 100 mg by mouth 2 (two) times daily.   Vitamin D, Ergocalciferol, (DRISDOL) 1.25 MG (50000 UNIT) CAPS capsule Take 50,000 Units by mouth every 7 (seven) days.   [DISCONTINUED] tirzepatide  (ZEPBOUND ) 5 MG/0.5ML Pen Inject 5 mg into the skin once a week.   Allergies: Patient is allergic to methimazole. Family History: Patient family history includes Diabetes in her father; Heart disease in her father; Hyperlipidemia in her father and mother. Social History:  Patient  reports that she has never smoked. She has never used smokeless tobacco. She reports that she does not drink alcohol and does not use drugs.  Review of Systems: Constitutional: Negative for fever malaise or anorexia Cardiovascular: negative for chest pain Respiratory: negative for SOB or persistent cough Gastrointestinal: negative for abdominal pain  Objective  Vitals: BP 138/82   Pulse 88   Temp (!) 97.1 F (36.2 C)   Ht 5' 4 (1.626 m)   Wt 171 lb 12.8 oz (77.9 kg)   SpO2 100%   BMI 29.49 kg/m  General: no acute distress , A&Ox3 HEENT: PEERL, conjunctiva normal, neck is supple Cardiovascular:  RRR without murmur or gallop.  Respiratory:  Good breath sounds bilaterally, CTAB with normal respiratory effort Skin:  Warm, no rashes Commons side effects, risks, benefits, and alternatives for medications and treatment plan prescribed today were discussed, and the patient expressed understanding of the given instructions. Patient is instructed to call or message via MyChart if he/she has any questions or concerns regarding our treatment plan. No barriers to understanding were identified. We discussed Red Flag  symptoms and signs in detail. Patient expressed understanding regarding what to do in case of urgent or emergency type symptoms.  Medication list was reconciled, printed and provided to the patient in AVS. Patient instructions and summary information was reviewed with the patient as documented in the AVS. This note was prepared with assistance of Dragon voice recognition software. Occasional wrong-word or sound-a-like substitutions may have occurred due to the inherent limitations of voice recognition software

## 2023-10-16 LAB — T3: T3, Total: 117 ng/dL (ref 76–181)

## 2023-10-20 ENCOUNTER — Telehealth: Payer: Self-pay

## 2023-10-20 ENCOUNTER — Other Ambulatory Visit (HOSPITAL_COMMUNITY): Payer: Self-pay

## 2023-10-20 NOTE — Telephone Encounter (Deleted)
 Pharmacy Patient Advocate Encounter   Received notification from [employer] that prior authorization for Zepbound  2.5MG /0.5ML pen-injectors is required/requested.   Insurance verification completed.   The patient is insured through [facility] .   Per test claim: PA required; PA submitted to above mentioned insurance via CoverMyMeds Key/confirmation #/EOC [id number] Status is pending

## 2023-10-20 NOTE — Telephone Encounter (Signed)
 Pharmacy Patient Advocate Encounter  Received notification from [facility] that Prior Authorization for Zepbound  2.5MG /0.5ML pen-injectors has been APPROVED from 10/20/23 to 10/19/24   PA #/Case ID/Reference #: [id number]

## 2023-10-20 NOTE — Telephone Encounter (Signed)
 ERROR

## 2023-10-20 NOTE — Telephone Encounter (Signed)
 Pharmacy Patient Advocate Encounter   Received notification from Onbase that prior authorization for Zepbound  2.5MG /0.5ML pen-injectors is required/requested.   Insurance verification completed.   The patient is insured through [facility] .   Per test claim: PA required; PA submitted to above mentioned insurance via CoverMyMeds Key/confirmation #/EOC [id number] Status is pending

## 2023-10-24 ENCOUNTER — Ambulatory Visit: Payer: Self-pay | Admitting: Family Medicine

## 2023-10-24 MED ORDER — ATORVASTATIN CALCIUM 10 MG PO TABS: 10.0000 mg | ORAL_TABLET | Freq: Every day | ORAL | 3 refills | Status: AC

## 2023-10-24 NOTE — Progress Notes (Signed)
 Labs reviewed.  The 10-year ASCVD risk score (Arnett DK, et al., 2019) is: 5.2%, rec statin given ldl 199   Values used to calculate the score:     Age: 48 years     Clincally relevant sex: Female     Is Non-Hispanic African American: Yes     Diabetic: No     Tobacco smoker: No     Systolic Blood Pressure: 138 mmHg     Is BP treated: No     HDL Cholesterol: 37.3 mg/dL     Total Cholesterol: 257 mg/dL  Dear Brandy Cole, Thank you for allowing me to care for you at your recent office visit.  I wanted to let you know that I have reviewed your lab test results and am happy to report that they are good overall.  However , your cholesterol remains way too high. Due to the increased risk of cardiovascular disease at these levels, I recommend starting a cholesterol lowering medication:  lipitor 10mg  nightly. I have ordered this for you to start. Please schedule a visit if you'd like to discuss further. I will recheck your levels at your physical appointment in December to ensure the medication is working well.  Sincerely, Dr. Jodie

## 2023-11-13 DIAGNOSIS — Z01419 Encounter for gynecological examination (general) (routine) without abnormal findings: Secondary | ICD-10-CM | POA: Diagnosis not present

## 2023-11-13 DIAGNOSIS — Z1231 Encounter for screening mammogram for malignant neoplasm of breast: Secondary | ICD-10-CM | POA: Diagnosis not present

## 2023-11-13 DIAGNOSIS — Z113 Encounter for screening for infections with a predominantly sexual mode of transmission: Secondary | ICD-10-CM | POA: Diagnosis not present

## 2023-11-13 DIAGNOSIS — L309 Dermatitis, unspecified: Secondary | ICD-10-CM | POA: Diagnosis not present

## 2023-11-13 LAB — HM MAMMOGRAPHY

## 2024-02-06 DIAGNOSIS — E0590 Thyrotoxicosis, unspecified without thyrotoxic crisis or storm: Secondary | ICD-10-CM | POA: Diagnosis not present

## 2024-03-05 IMAGING — MR MR ANKLE*L* W/O CM
4 of 5 series · 12 of 40 positions shown · non-contrast
Comparison: Left foot radiographs 07/22/2012

CLINICAL DATA: Left ankle pain. Medial ankle pain and midfoot pain
radiating towards the forefoot. Fell at work [DATE].

EXAM:
MRI OF THE LEFT ANKLE WITHOUT CONTRAST
TECHNIQUE: Multiplanar, multisequence MR imaging of the ankle was performed. No
intravenous contrast was administered.

[Series 4: PD fat-sat · axial · left · 3.0mm · 0.25mm/px · z∈[-68,+20]mm · 3 of 30 slices shown]
[im 4/30]
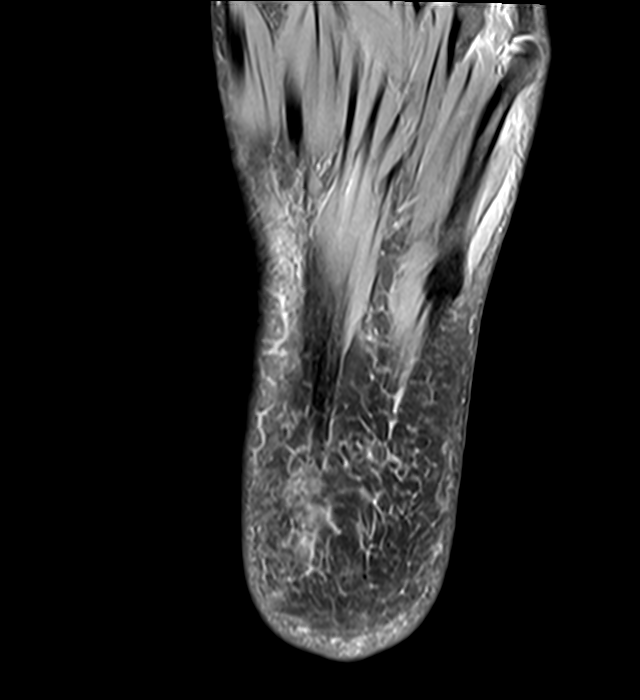
[im 15/30]
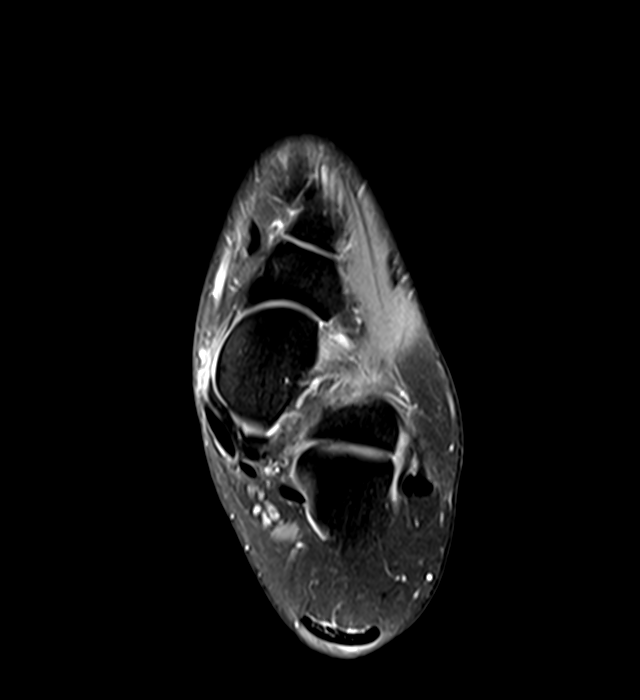
[im 26/30]
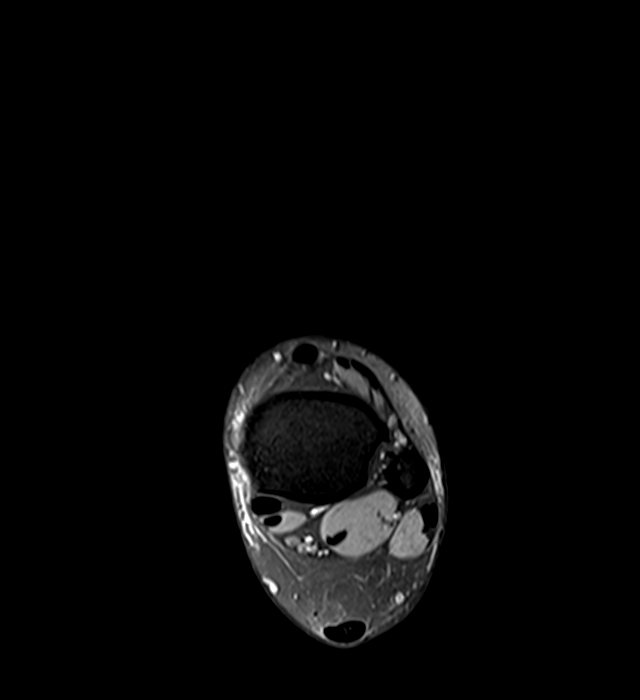

[Series 5: T2 fat-sat · axial · left · 3.0mm · 0.25mm/px · z∈[-64,+16]mm · 3 of 30 slices shown (1 of 2)]
[im 5/30]
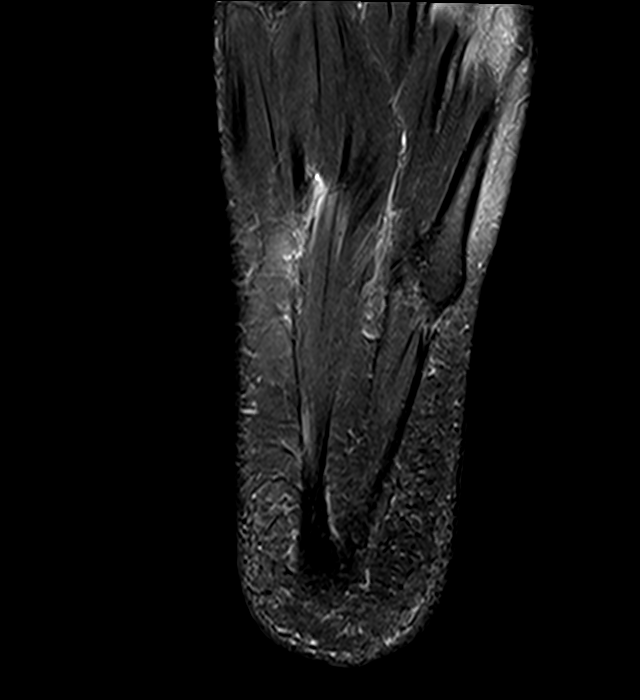
[im 17/30]
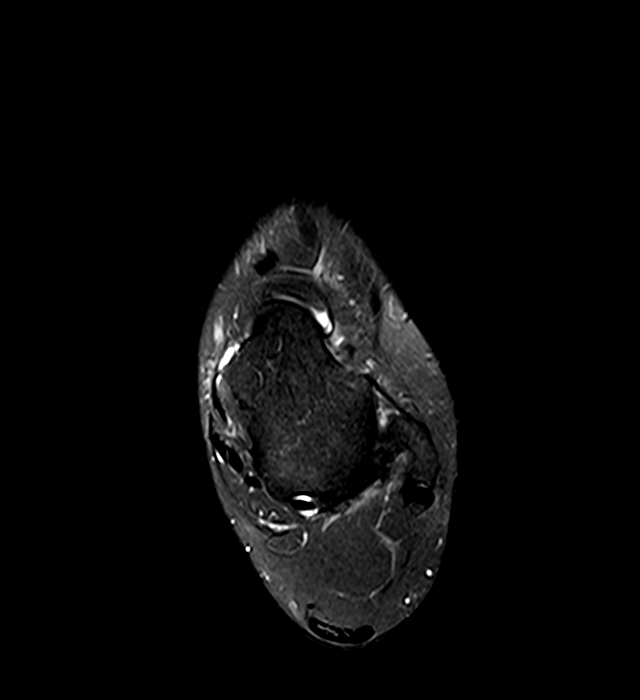
[im 25/30]
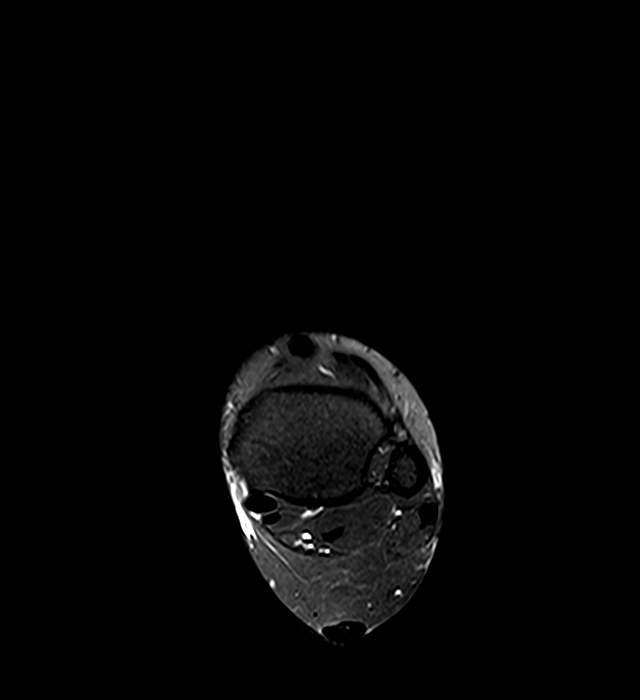

[Series 6: T1 · sagittal · left · 4.0mm · 0.27mm/px · 3 of 24 slices shown]
[im 5/24]
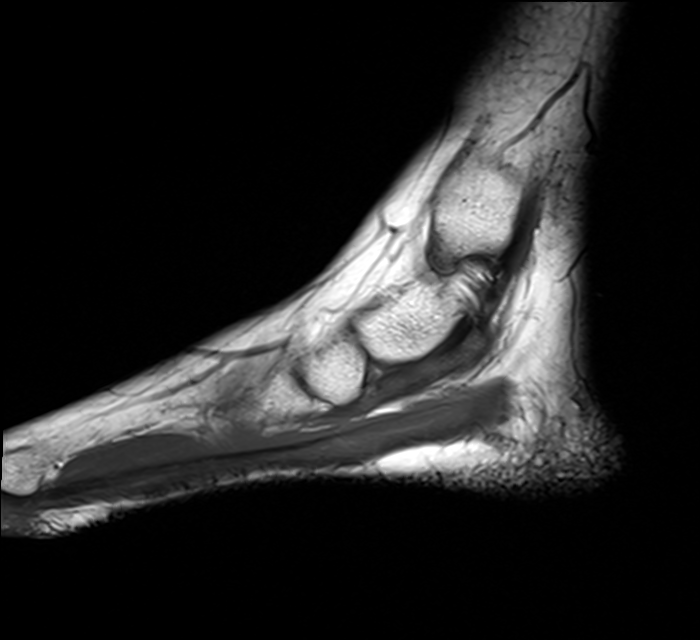
[im 14/24]
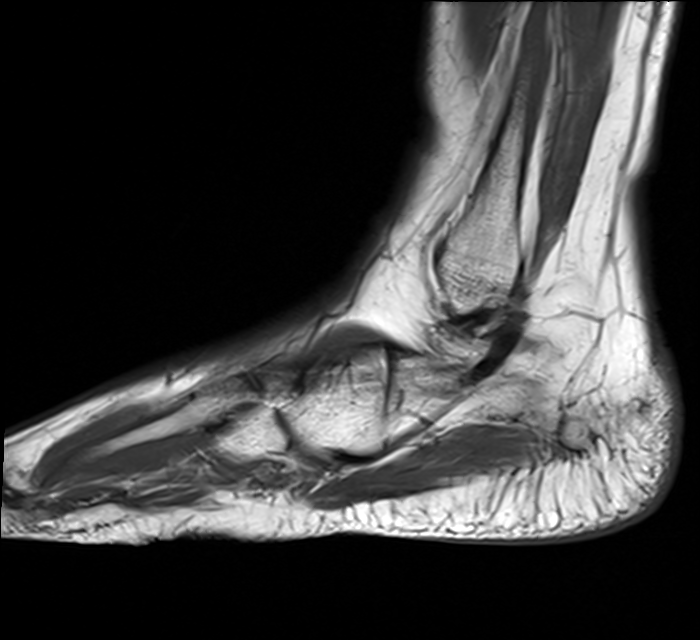
[im 24/24]
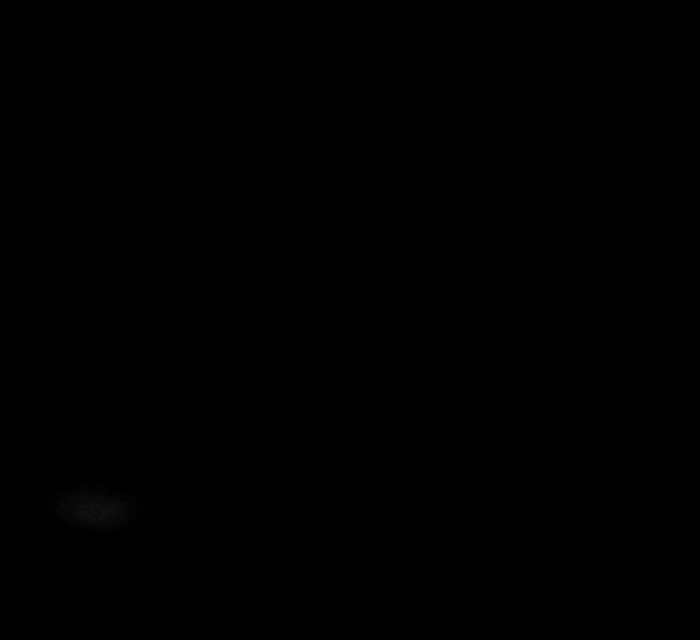

[Series 8: T2 fat-sat · coronal · left · 3.0mm · 0.25mm/px · 3 of 43 slices shown (2 of 2)]
[im 5/43]
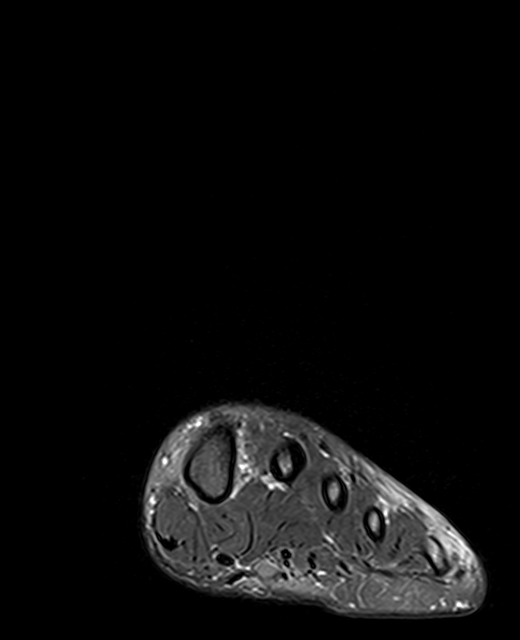
[im 22/43]
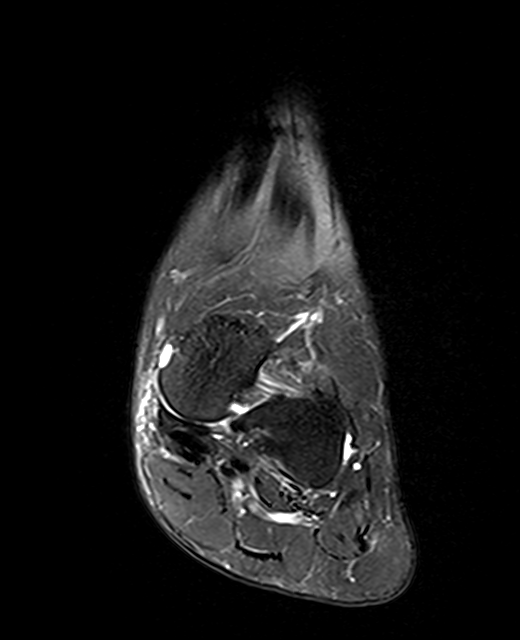
[im 38/43]
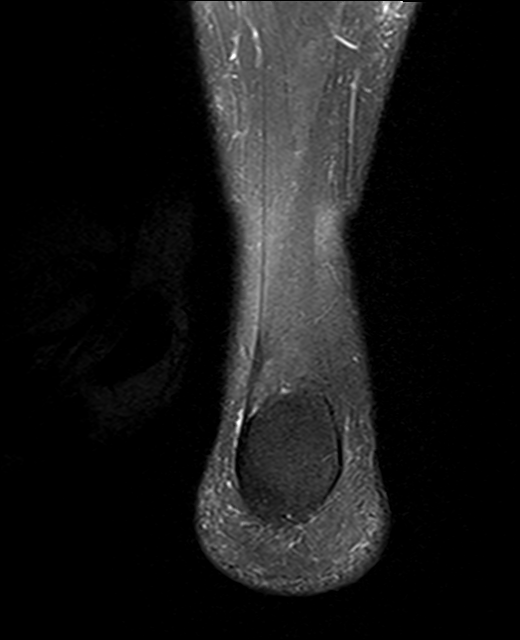

[12 of 40 positions shown; findings below may reference images not displayed]

FINDINGS: TENDONS

Peroneal: The peroneus longus and brevis tendons are intact.

Posteromedial: Minimal posterior tibial tenosynovitis. Minimal
intermediate T2 signal within the distal posterior tibial tendon
(axial series 5, image 14), minimal tendinosis. The flexor digitorum
longus and flexor hallucis longus tendons are intact.

Anterior: The tibialis anterior, extensor hallucis longus, and
extensor digitorum longus tendons are intact.

Achilles: Intact.

Plantar Fascia: Intact.

LIGAMENTS

Lateral: Mild attenuation of the anterior talofibular ligament may
represent the patient's normal anatomy versus a remote
partial-thickness tear. No surrounding edema to indicate acute
injury. The calcaneofibular posterior talofibular and anterior and
posterior tibiofibular ligaments are intact.

Medial: The tibiotalar deep deltoid and tibial spring ligaments are
intact.

CARTILAGE

Ankle Joint: There is high-grade thinning of the far anterolateral
aspect of the talar dome cartilage in a region measuring up to 3 mm
in transverse dimension (coronal series 8 images 17 through 19) and
8 mm in AP dimension.

Subtalar Joints/Sinus Tarsi: Fat is preserved within sinus tarsi.

Bones: No acute fracture.

Other: None.
IMPRESSION: :
IMPRESSION: 1. High-grade thinning of the far anterolateral aspect of the talar
dome cartilage with mild subchondral marrow edema.
2. Mild attenuation of the anterior talofibular ligament without
surrounding edema. This may represent the patient's normal anatomy
versus a remote partial-thickness tear.
3. Minimal posterior tibial tenosynovitis and tendinosis.

## 2024-04-13 ENCOUNTER — Ambulatory Visit (INDEPENDENT_AMBULATORY_CARE_PROVIDER_SITE_OTHER): Payer: Commercial Managed Care - HMO | Admitting: Family Medicine

## 2024-04-13 ENCOUNTER — Encounter: Payer: Self-pay | Admitting: Family Medicine

## 2024-04-13 VITALS — BP 116/68 | HR 77 | Temp 97.3°F | Ht 64.0 in | Wt 170.6 lb

## 2024-04-13 DIAGNOSIS — Z7185 Encounter for immunization safety counseling: Secondary | ICD-10-CM | POA: Diagnosis not present

## 2024-04-13 DIAGNOSIS — E782 Mixed hyperlipidemia: Secondary | ICD-10-CM

## 2024-04-13 DIAGNOSIS — E0500 Thyrotoxicosis with diffuse goiter without thyrotoxic crisis or storm: Secondary | ICD-10-CM | POA: Diagnosis not present

## 2024-04-13 DIAGNOSIS — E669 Obesity, unspecified: Secondary | ICD-10-CM | POA: Diagnosis not present

## 2024-04-13 DIAGNOSIS — R7303 Prediabetes: Secondary | ICD-10-CM | POA: Diagnosis not present

## 2024-04-13 DIAGNOSIS — Z0001 Encounter for general adult medical examination with abnormal findings: Secondary | ICD-10-CM | POA: Diagnosis not present

## 2024-04-13 DIAGNOSIS — R030 Elevated blood-pressure reading, without diagnosis of hypertension: Secondary | ICD-10-CM

## 2024-04-13 LAB — LIPID PANEL
Cholesterol: 169 mg/dL (ref 28–200)
HDL: 51.4 mg/dL
LDL Cholesterol: 105 mg/dL — ABNORMAL HIGH (ref 10–99)
NonHDL: 117.33
Total CHOL/HDL Ratio: 3
Triglycerides: 62 mg/dL (ref 10.0–149.0)
VLDL: 12.4 mg/dL (ref 0.0–40.0)

## 2024-04-13 LAB — COMPREHENSIVE METABOLIC PANEL WITH GFR
ALT: 13 U/L (ref 3–35)
AST: 15 U/L (ref 5–37)
Albumin: 4.5 g/dL (ref 3.5–5.2)
Alkaline Phosphatase: 66 U/L (ref 39–117)
BUN: 14 mg/dL (ref 6–23)
CO2: 26 meq/L (ref 19–32)
Calcium: 9.3 mg/dL (ref 8.4–10.5)
Chloride: 103 meq/L (ref 96–112)
Creatinine, Ser: 0.8 mg/dL (ref 0.40–1.20)
GFR: 86.13 mL/min
Glucose, Bld: 88 mg/dL (ref 70–99)
Potassium: 3.9 meq/L (ref 3.5–5.1)
Sodium: 137 meq/L (ref 135–145)
Total Bilirubin: 1.1 mg/dL (ref 0.2–1.2)
Total Protein: 7.4 g/dL (ref 6.0–8.3)

## 2024-04-13 LAB — CBC WITH DIFFERENTIAL/PLATELET
Basophils Absolute: 0 K/uL (ref 0.0–0.1)
Basophils Relative: 0.7 % (ref 0.0–3.0)
Eosinophils Absolute: 0.1 K/uL (ref 0.0–0.7)
Eosinophils Relative: 2.3 % (ref 0.0–5.0)
HCT: 36.5 % (ref 36.0–46.0)
Hemoglobin: 12.5 g/dL (ref 12.0–15.0)
Lymphocytes Relative: 30.9 % (ref 12.0–46.0)
Lymphs Abs: 1.5 K/uL (ref 0.7–4.0)
MCHC: 34.2 g/dL (ref 30.0–36.0)
MCV: 91.9 fl (ref 78.0–100.0)
Monocytes Absolute: 0.3 K/uL (ref 0.1–1.0)
Monocytes Relative: 6.3 % (ref 3.0–12.0)
Neutro Abs: 3 K/uL (ref 1.4–7.7)
Neutrophils Relative %: 59.8 % (ref 43.0–77.0)
Platelets: 394 K/uL (ref 150.0–400.0)
RBC: 3.97 Mil/uL (ref 3.87–5.11)
RDW: 14.7 % (ref 11.5–15.5)
WBC: 5 K/uL (ref 4.0–10.5)

## 2024-04-13 LAB — TSH: TSH: 1.25 u[IU]/mL (ref 0.35–5.50)

## 2024-04-13 LAB — HEMOGLOBIN A1C: Hgb A1c MFr Bld: 5.6 % (ref 4.6–6.5)

## 2024-04-13 NOTE — Patient Instructions (Signed)
 Please return in 12 months for your annual complete physical; please come fasting.   I will release your lab results to you on your MyChart account with further instructions. You may see the results before I do, but when I review them I will send you a message with my report or have my assistant call you if things need to be discussed. Please reply to my message with any questions. Thank you!   If you have any questions or concerns, please don't hesitate to send me a message via MyChart or call the office at [phone]. Thank you for visiting with us  today! It's our pleasure caring for you.   Please do these things to maintain good health!  Exercise at least 30-45 minutes a day,  4-5 days a week.  Eat a low-fat diet with lots of fruits and vegetables, up to 7-9 servings per day. Drink plenty of water daily. Try to drink 8 8oz glasses per day. Seatbelts can save your life. Always wear your seatbelt. Place Smoke Detectors on every level of your home and check batteries every year. Schedule an appointment with an eye doctor for an eye exam every 1-2 years Safe sex - use condoms to protect yourself from STDs if you could be exposed to these types of infections. Use birth control if you do not want to become pregnant and are sexually active. Avoid heavy alcohol use. If you drink, keep it to less than 2 drinks/day and not every day. Health Care Power of Attorney.  Choose someone you trust that could speak for you if you became unable to speak for yourself. Depression is common in our stressful world.If you're feeling down or losing interest in things you normally enjoy, please come in for a visit. If anyone is threatening or hurting you, please get help. Physical or Emotional Violence is never OK.

## 2024-04-13 NOTE — Progress Notes (Signed)
 " Subjective  Chief Complaint  Patient presents with   Annual Exam    Annual exam with PCP. No questions or concerns. Has fasted for labs today.     HPI: Brandy Cole is a 48 y.o. female who presents to [facility] [place] today for a Female Wellness Visit. She also has the concerns and/or needs as listed above in the chief complaint. These will be addressed in addition to the Health Maintenance Visit.   Wellness Visit: annual visit with health maintenance review and exam  HM: pap and mammo and CRC all current. Sees gyn. Eligible for shingrix, prevnar but defers both for now. Declines flu/covid. Feels great overall.   Chronic disease f/u and/or acute problem visit: (deemed necessary to be done in addition to the wellness visit): Discussed the use of AI scribe software for clinical note transcription with the patient, who gave verbal consent to proceed.  History of Present Illness Brandy Cole is a 48 year old female with hyperlipidemia and hypertension who presents for a follow-up visit.  Hyperlipidemia- started lipitor 10 in June for LDL 199 - Tolerating cholesterol medication without adverse effects - Cholesterol medication initiated for previously elevated cholesterol levels - Dietary modifications implemented to improve cholesterol profile  Weight management - Currently taking zepbound  5 mg every two weeks for weight management - Weight loss of over 20 pounds achieved - Current weight is 173 pounds, BMI 29.28; goal BMI < 28 - Goal to lose an additional 5 to 10 pounds  Hypertension - Elevated blood pressure recorded at recent endocrinology visit - Does not routinely monitor blood pressure at home despite owning a cuff - No associated stress or nervousness at the time of elevated reading - h/o prehypertension that improved with weight loss. Normotensive here in office today  Prediabetes - History of prediabetes with previously elevated A1c levels - diet is  good  Graves disease: reviewed recent endocrinology notes and lab results. Stable thyroid  and ocular health.   Menstrual history - Menstrual cycles remain regular, occurring every four weeks    Assessment  1. Encounter for well adult exam with abnormal findings   2. Prediabetes   3. Graves disease   4. Prehypertension   5. Obesity (BMI 30-39.9)   6. Mixed hyperlipidemia   7. History of DVT of lower extremity      Plan  Female Wellness Visit: Age appropriate Health Maintenance and Prevention measures were discussed with patient. Included topics are cancer screening recommendations, ways to keep healthy (see AVS) including dietary and exercise recommendations, regular eye and dental care, use of seat belts, and avoidance of moderate alcohol use and tobacco use.  BMI: discussed patient's BMI and encouraged positive lifestyle modifications to help get to or maintain a target BMI. HM needs and immunizations were addressed and ordered. See below for orders. See HM and immunization section for updates. Vaccine education; rec shingrix and prevnar. Pt will consider in future Routine labs and screening tests ordered including cmp, cbc and lipids where appropriate. Discussed recommendations regarding Vit D and calcium  supplementation (see AVS)  Chronic disease management visit and/or acute problem visit: Assessment and Plan Assessment & Plan  Mixed hyperlipidemia Previously elevated cholesterol levels, likely due to diet and genetics. Currently on Lipitor with improved diet. - Checked cholesterol levels - Will adjust Lipitor dose if cholesterol levels are not at target - check lfts - tolerating well  Obesity BMI higher than ideal with over 20 pounds lost. Currently on  Mounjaro for weight management. Discussed potential for further weight loss to improve health parameters, considering predisposition to prediabetes and prehypertension. - Increase Mounjaro to weekly dosing for weight  management - Monitor weight and BMI  Prediabetes Previously elevated blood sugar levels, currently well-controlled with Mounjaro. Discussed importance of maintaining weight closer to ideal to manage metabolic predisposition. - Continue Mounjaro for blood sugar control  Prehypertension Previously elevated blood pressure readings, currently well-controlled. Discussed importance of monitoring blood pressure at home to ensure stability. - Monitor blood pressure at home - Consider using a blood pressure cuff regularly  Graves disease Thyroid  levels well-controlled  Migraines are controlled.     Follow up: 12 mo for cpe  Orders Placed This Encounter  Procedures   CBC with Differential/Platelet   Comprehensive metabolic panel with GFR   Lipid panel   Hemoglobin A1c   TSH   No orders of the defined types were placed in this encounter.     Body mass index is 29.28 kg/m. 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 Patient reports she had surgery on her ankle for a torn tendon on 08/15/22. She was started on aspirin 325 mg daily for DVT prevention post surgery. Postoperative DVT while on COCs. Estrogen was stopped. 3 months of eloquis for treatment.     Obesity (BMI 30-39.9) 04/03/2021     Priority: Medium     Started tirzepatide  feb 2025    Multiple thyroid   nodules 10/28/2014     Priority: Medium    Chronic daily headache 07/15/2013     Priority: Medium    Insomnia 12/18/2010     Priority: Medium     Overview:  Sleep study done. No apnea    Migraine 10/25/2010     Priority: Medium     Overview:  Neurology - in [facility]; nl CT scan in past. Neurology dr.patel managing    Recurrent vaginitis 02/02/2016     Priority: Low   Health Maintenance  Topic Date Due   Mammogram  08/01/2023   COVID-19 Vaccine (5 - 2025-26 season) 04/29/2024 (Originally 12/22/2023)   Zoster Vaccines- Shingrix (1 of 2) 07/12/2024 (Originally 03/24/1993)   DTaP/Tdap/Td (2 - Td or Tdap) 04/13/2025 (Originally 10/20/2023)   Pneumococcal Vaccine: 50+ Years (1 of 1 - PCV) 04/13/2025 (Originally 03/24/2024)   Hepatitis B Vaccines 19-59 Average Risk (1 of 3 - 19+ 3-dose series) 04/13/2025 (Originally 03/24/1993)   Cervical Cancer Screening (HPV/Pap Cotest)  08/10/2027   Colonoscopy  09/09/2030   Hepatitis C Screening  Completed   HIV Screening  Completed   HPV VACCINES  Aged Out   Meningococcal B Vaccine  Aged Out   Influenza Vaccine  Discontinued   Immunization  History  Administered Date(s) Administered   Hepatitis A, Ped/Adol-2 Dose 04/29/2014   Moderna Covid-19 Vaccine Bivalent Booster 11yrs & up 03/04/2021   PFIZER(Purple Top)SARS-COV-2 Vaccination 08/11/2019, 09/01/2019, 04/06/2020   Tdap 10/19/2013   We updated and reviewed the patient's past history in detail and it is documented below. Allergies: Patient is allergic to methimazole. Past Medical History Patient  has a past medical history of FHx: premature coronary heart disease, H/O chlamydia infection, Migraine, Oral contraceptive use (10/19/2013), Thyroid  disease, and Torn tendon. Past Surgical History Patient  has a past surgical history that includes Wisdom tooth extraction and torn tendon. Family History: Patient family history includes Diabetes in her father; Heart disease in her father; Hyperlipidemia in  her father and mother. Social History:  Patient  reports that she has never smoked. She has never used smokeless tobacco. She reports that she does not drink alcohol and does not use drugs.  Review of Systems: Constitutional: negative for fever or malaise Ophthalmic: negative for photophobia, double vision or loss of vision Cardiovascular: negative for chest pain, dyspnea on exertion, or new LE swelling Respiratory: negative for SOB or persistent cough Gastrointestinal: negative for abdominal pain, change in bowel habits or melena Genitourinary: negative for dysuria or gross hematuria, no abnormal uterine bleeding or disharge Musculoskeletal: negative for new gait disturbance or muscular weakness Integumentary: negative for new or persistent rashes, no breast lumps Neurological: negative for TIA or stroke symptoms Psychiatric: negative for SI or delusions Allergic/Immunologic: negative for hives  Patient Care Team    Relationship Specialty Notifications Start End  Jodie Lavern CROME, MD PCP - General Family Medicine  10/31/17   Raeanne Shanda SQUIBB, MD (Inactive) Consulting Physician Obstetrics and Gynecology  11/13/17   Murray Maude BRAVO, NP Nurse Practitioner Bariatrics  11/13/17   Durenda Neptune, MD Referring Physician Internal Medicine  11/13/17   Lita Lye, OD Referring Physician Optometry  03/06/18   Tobie Tonita POUR, DO Consulting Physician Neurology  04/03/21    Comment: migraines and abnl UE mvts,    Objective  Vitals: BP 116/68 (BP Location: Left Arm, Patient Position: Sitting, Cuff Size: Normal)   Pulse 77   Temp (!) 97.3 F (36.3 C) (Temporal)   Ht 5' 4 (1.626 m)   Wt 170 lb 9.6 oz (77.4 kg)   LMP 03/17/2024   SpO2 99%   BMI 29.28 kg/m  General:  Well developed, well nourished, no acute distress  Psych:  Alert and orientedx3,normal mood and affect HEENT:  Normocephalic, atraumatic, non-icteric sclera,  supple neck without adenopathy, mass or thyromegaly Cardiovascular:   Normal S1, S2, RRR without gallop, rub or murmur Respiratory:  Good breath sounds bilaterally, CTAB with normal respiratory effort Gastrointestinal: normal bowel sounds, soft, non-tender, no noted masses. No HSM MSK: extremities without edema, joints without erythema or swelling Neurologic:    Mental status is normal.  Gross motor and sensory exams are normal.  No tremor  Commons side effects, risks, benefits, and alternatives for medications and treatment plan prescribed today were discussed, and the patient expressed understanding of the given instructions. Patient is instructed to call or message via MyChart if he/she has any questions or concerns regarding our treatment plan. No barriers to understanding were identified. We discussed Red Flag symptoms and signs in detail. Patient expressed understanding regarding what to do in case of urgent or emergency type symptoms.  Medication list was reconciled, printed and provided to the patient in AVS. Patient instructions and summary information was reviewed with the patient  as documented in the AVS. This note was prepared with assistance of Dragon voice recognition software. Occasional wrong-word or sound-a-like substitutions may have occurred due to the inherent limitations of voice recognition software     "

## 2024-04-16 ENCOUNTER — Ambulatory Visit: Payer: Self-pay | Admitting: Family Medicine

## 2024-04-16 NOTE — Progress Notes (Signed)
 Labs reviewed.  The 10-year ASCVD risk score (Arnett DK, et al., 2019) is: 1.2%   Values used to calculate the score:     Age: 48 years     Clinically relevant sex: Female     Is Non-Hispanic African American: Yes     Diabetic: No     Tobacco smoker: No     Systolic Blood Pressure: 116 mmHg     Is BP treated: No     HDL Cholesterol: 51.4 mg/dL     Total Cholesterol: 169 mg/dL

## 2025-04-18 ENCOUNTER — Encounter: Admitting: Family Medicine
